# Patient Record
Sex: Female | Born: 1950 | Race: White | Hispanic: No | Smoking: Never smoker
Health system: Southern US, Community
[De-identification: ages and names within clinical notes are randomized; demographics above are authoritative.]

## PROBLEM LIST (undated history)

## (undated) DIAGNOSIS — I2699 Other pulmonary embolism without acute cor pulmonale: Secondary | ICD-10-CM

## (undated) DIAGNOSIS — M1990 Unspecified osteoarthritis, unspecified site: Secondary | ICD-10-CM

## (undated) DIAGNOSIS — I10 Essential (primary) hypertension: Secondary | ICD-10-CM

## (undated) DIAGNOSIS — G5600 Carpal tunnel syndrome, unspecified upper limb: Secondary | ICD-10-CM

## (undated) HISTORY — PX: CHOLECYSTECTOMY: SHX55

## (undated) HISTORY — PX: ORIF ANKLE FRACTURE: SHX5408

## (undated) HISTORY — DX: Essential (primary) hypertension: I10

## (undated) HISTORY — PX: ABDOMINAL HYSTERECTOMY: SHX81

## (undated) HISTORY — PX: KNEE REPAIR EXTENSOR MECHANISM: SHX6613

## (undated) HISTORY — DX: Unspecified osteoarthritis, unspecified site: M19.90

## (undated) HISTORY — PX: HAMMER TOE SURGERY: SHX385

## (undated) HISTORY — PX: MOLE REMOVAL: SHX2046

---

## 2019-05-16 HISTORY — PX: BIOPSY THYROID: PRO38

## 2019-06-18 ENCOUNTER — Other Ambulatory Visit: Payer: Self-pay

## 2019-06-19 ENCOUNTER — Encounter: Payer: Self-pay | Admitting: Family Medicine

## 2019-06-19 ENCOUNTER — Ambulatory Visit (INDEPENDENT_AMBULATORY_CARE_PROVIDER_SITE_OTHER): Payer: Medicare Other | Admitting: Family Medicine

## 2019-06-19 VITALS — BP 122/70 | HR 46 | Temp 98.2°F | Ht 62.0 in | Wt 262.8 lb

## 2019-06-19 DIAGNOSIS — M170 Bilateral primary osteoarthritis of knee: Secondary | ICD-10-CM | POA: Diagnosis not present

## 2019-06-19 DIAGNOSIS — I10 Essential (primary) hypertension: Secondary | ICD-10-CM | POA: Insufficient documentation

## 2019-06-19 NOTE — Progress Notes (Signed)
New Patient Office Visit  Subjective:  Patient ID: Kristin Lambert, female    DOB: August 26, 1950  Age: 69 y.o. MRN: [id number]  CC:  Chief Complaint  Patient presents with  . Establish Care    New patient, no concerns.     HPI Kristin Lambert presents for establishment of care.  Her husband has Alzheimer's disease and they had moved into this area from [place] to be closer to supportive family.  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  She has had a knee replacement on the right side.  Continues to have severe chronic knee pain.  She mostly takes Tylenol as needed.  Cannot take NSAIDs secondary to a rash.  She takes Norco on occasion.  Blood pressure is controlled.  Her medical list says that she is taking metoprolol succinate 100 mg daily.  Another list says that she is taking metoprolol metoprolol unspecified 100 mg twice daily.  She is not sure which one she is taking.  Past Medical History:  Diagnosis Date  . Arthritis   . Hypertension     Past Surgical History:  Procedure Laterality Date  . ABDOMINAL HYSTERECTOMY    . CESAREAN SECTION    . CHOLECYSTECTOMY    . KNEE REPAIR EXTENSOR MECHANISM Right     History reviewed. No pertinent family history.  Social History   Socioeconomic History  . Marital status: Married    Spouse name: Not on file  . Number of children: Not on file  . Years of education: Not on file  . Highest education level: Not on file  Occupational History  . Not on file  Tobacco Use  . Smoking status: Never Smoker  . Smokeless tobacco: Never Used  Substance and Sexual Activity  . Alcohol use: Never  . Drug use: Never  . Sexual activity: Not Currently  Other Topics Concern  . Not on file  Social History Narrative  . Not on file   Social Determinants of Health   Financial Resource Strain:   . Difficulty of Paying Living  Expenses:   Food Insecurity:   . Worried About Charity fundraiser in the Last Year:   . Arboriculturist in the Last Year:   Transportation Needs:   . Film/video editor (Medical):   [name] Lack of Transportation (Non-Medical):   Physical Activity:   . Days of Exercise per Week:   . Minutes of Exercise per Session:   Stress:   . Feeling of Stress :   Social Connections:   . Frequency of Communication with Friends and Family:   . Frequency of Social Gatherings with Friends and Family:   . Attends Religious Services:   . Active Member of Clubs or Organizations:   . Attends Archivist Meetings:   [name] Marital Status:   Intimate Partner Violence:   . Fear of Current or Ex-Partner:   . Emotionally Abused:   [name] Physically Abused:   . Sexually Abused:     ROS Review of Systems  Constitutional: Negative.   HENT: Negative.   Eyes: Negative for photophobia and visual disturbance.  Respiratory: Negative.   Cardiovascular: Negative.   Endocrine: Negative for polyphagia and polyuria.  Genitourinary: Negative.   Musculoskeletal: Positive for arthralgias and gait problem.  Allergic/Immunologic: Negative for immunocompromised state.  Neurological: Negative for light-headedness and numbness.  Hematological:  Does not bruise/bleed easily.  Psychiatric/Behavioral: Negative.     Objective:   Today's Vitals: BP 122/70   Pulse (!) 46   Temp 98.2 F (36.8 C) (Tympanic)   Ht 5\' 2"  (1.575 m)   Wt 262 lb 12.8 oz (119.2 kg)   SpO2 95%   BMI 48.07 kg/m   Physical Exam Vitals and nursing note reviewed.  Constitutional:      General: She is not in acute distress.    Appearance: Normal appearance. She is obese. She is not ill-appearing, toxic-appearing or diaphoretic.  HENT:     Head: Normocephalic and atraumatic.  Eyes:     General: No scleral icterus.       Right eye: No discharge.        Left eye: No discharge.     Conjunctiva/sclera: Conjunctivae normal.  Pulmonary:      Effort: Pulmonary effort is normal.  Neurological:     Mental Status: She is alert and oriented to person, place, and time.  Psychiatric:        Mood and Affect: Mood normal.        Behavior: Behavior normal.     Assessment & Plan:   Problem List Items Addressed This Visit      Cardiovascular and Mediastinum   Essential hypertension - Primary   Relevant Medications   metoprolol succinate (TOPROL-XL) 100 MG 24 hr tablet   hydrochlorothiazide (HYDRODIURIL) 25 MG tablet   losartan (COZAAR) 100 MG tablet   simvastatin (ZOCOR) 40 MG tablet   aspirin EC 81 MG tablet   amlodipine-benazepril (LOTREL) 2.5-10 MG capsule     Musculoskeletal and Integument   Arthritis of both knees   Relevant Medications   acetaminophen (TYLENOL) 500 MG tablet   aspirin EC 81 MG tablet   HYDROcodone-acetaminophen (NORCO) 7.5-325 MG tablet      Outpatient Encounter Medications as of 06/19/2019  Medication Sig  . acetaminophen (TYLENOL) 500 MG tablet Take 500 mg by mouth every 6 (six) hours as needed.  [name] amlodipine-benazepril (LOTREL) 2.5-10 MG capsule Take 1 capsule by mouth at bedtime.  [name] aspirin EC 81 MG tablet Take 81 mg by mouth daily.  . hydrochlorothiazide (HYDRODIURIL) 25 MG tablet Take 25 mg by mouth daily.  [name] HYDROcodone-acetaminophen (NORCO) 7.5-325 MG tablet Take 1 tablet by mouth every 6 (six) hours as needed for moderate pain.  [name] losartan (COZAAR) 100 MG tablet Take 100 mg by mouth daily.  . metoprolol succinate (TOPROL-XL) 100 MG 24 hr tablet Take 100 mg by mouth daily.  [name] oxybutynin (DITROPAN) 5 MG tablet Take 5 mg by mouth 2 (two) times daily.  . potassium chloride (KLOR-CON) 10 MEQ tablet Take 10 mEq by mouth daily.  . simvastatin (ZOCOR) 40 MG tablet Take 40 mg by mouth at bedtime.   No facility-administered encounter medications on file as of 06/19/2019.    Follow-up: Return in about 1 month (around 07/19/2019).   Patient's pulse rate is too low.  She will check her metoprolol type  and follow-up with me.  Otherwise I will see her in 1 month.  Libby Maw, MD

## 2019-07-21 ENCOUNTER — Ambulatory Visit: Payer: Medicare Other | Admitting: Family Medicine

## 2019-07-24 ENCOUNTER — Telehealth: Payer: Self-pay | Admitting: Family Medicine

## 2019-07-24 NOTE — Telephone Encounter (Signed)
Patient is calling and requested a call back, please advise. CB is [phone]

## 2019-07-29 ENCOUNTER — Other Ambulatory Visit: Payer: Self-pay

## 2019-07-30 ENCOUNTER — Encounter: Payer: Self-pay | Admitting: Family Medicine

## 2019-07-30 ENCOUNTER — Ambulatory Visit (INDEPENDENT_AMBULATORY_CARE_PROVIDER_SITE_OTHER): Payer: Medicare Other | Admitting: Family Medicine

## 2019-07-30 VITALS — BP 134/80 | HR 67 | Temp 98.2°F | Ht 62.0 in | Wt 261.4 lb

## 2019-07-30 DIAGNOSIS — E7800 Pure hypercholesterolemia, unspecified: Secondary | ICD-10-CM | POA: Diagnosis not present

## 2019-07-30 DIAGNOSIS — I10 Essential (primary) hypertension: Secondary | ICD-10-CM

## 2019-07-30 DIAGNOSIS — E559 Vitamin D deficiency, unspecified: Secondary | ICD-10-CM | POA: Diagnosis not present

## 2019-07-30 DIAGNOSIS — E785 Hyperlipidemia, unspecified: Secondary | ICD-10-CM | POA: Insufficient documentation

## 2019-07-30 MED ORDER — METOPROLOL SUCCINATE ER 100 MG PO TB24: 100.0000 mg | ORAL_TABLET | Freq: Every day | ORAL | 4 refills | Status: DC

## 2019-07-30 NOTE — Progress Notes (Signed)
Established Patient Office Visit  Subjective:  Patient ID: Kristin Lambert, female    DOB: 1950-12-21  Age: 70 y.o. MRN: [id number]  CC:  Chief Complaint  Patient presents with  . Follow-up    1 month follow up on BP, no concerns.     HPI Kristin Lambert presents for follow-up of her blood pressure.  Turns out she has been taking metoprolol tartrate twice daily.  Blood pressure has been controlled with amlodipine 2.5, metoprolol losartan and HCTZ.  Potassium was added over this past year for hypokalemia.  She has been taking simvastatin for her elevated cholesterol.  In Ditropan for irritable bladder.  She is fasting these past 8 hours.  Past Medical History:  Diagnosis Date  . Arthritis   . Hypertension     Past Surgical History:  Procedure Laterality Date  . ABDOMINAL HYSTERECTOMY    . CESAREAN SECTION    . CHOLECYSTECTOMY    . KNEE REPAIR EXTENSOR MECHANISM Right     History reviewed. No pertinent family history.  Social History   Socioeconomic History  . Marital status: Married    Spouse name: Not on file  . Number of children: Not on file  . Years of education: Not on file  . Highest education level: Not on file  Occupational History  . Not on file  Tobacco Use  . Smoking status: Never Smoker  . Smokeless tobacco: Never Used  Vaping Use  . Vaping Use: Former  Substance and Sexual Activity  . Alcohol use: Never  . Drug use: Never  . Sexual activity: Not Currently  Other Topics Concern  . Not on file  Social History Narrative  . Not on file   Social Determinants of Health   Financial Resource Strain:   . Difficulty of Paying Living Expenses:   Food Insecurity:   . Worried About Charity fundraiser in the Last Year:   . Arboriculturist in the Last Year:   Transportation Needs:   . Film/video editor (Medical):   [name] Lack of Transportation (Non-Medical):   Physical Activity:   . Days of Exercise per Week:   . Minutes of Exercise per Session:   Stress:    . Feeling of Stress :   Social Connections:   . Frequency of Communication with Friends and Family:   . Frequency of Social Gatherings with Friends and Family:   . Attends Religious Services:   . Active Member of Clubs or Organizations:   . Attends Archivist Meetings:   [name] Marital Status:   Intimate Partner Violence:   . Fear of Current or Ex-Partner:   . Emotionally Abused:   [name] Physically Abused:   . Sexually Abused:     Outpatient Medications Prior to Visit  Medication Sig Dispense Refill  . acetaminophen (TYLENOL) 500 MG tablet Take 500 mg by mouth every 6 (six) hours as needed.    [name] amlodipine-benazepril (LOTREL) 2.5-10 MG capsule Take 1 capsule by mouth at bedtime.    . Ascorbic Acid (VITAMIN C) 100 MG tablet Take 100 mg by mouth daily.    [name] aspirin EC 81 MG tablet Take 81 mg by mouth daily.    . cholecalciferol (VITAMIN D3) 25 MCG (1000 UNIT) tablet Take 1,000 Units by mouth daily.    . hydrochlorothiazide (HYDRODIURIL) 25 MG tablet Take 25 mg by mouth daily.    [name] losartan (COZAAR) 100 MG tablet Take 100 mg by mouth daily.    [name]  oxybutynin (DITROPAN) 5 MG tablet Take 5 mg by mouth 2 (two) times daily.    . potassium chloride (KLOR-CON) 10 MEQ tablet Take 10 mEq by mouth daily.    . simvastatin (ZOCOR) 40 MG tablet Take 40 mg by mouth at bedtime.    . metoprolol succinate (TOPROL-XL) 100 MG 24 hr tablet Take 100 mg by mouth daily.    [name] HYDROcodone-acetaminophen (NORCO) 7.5-325 MG tablet Take 1 tablet by mouth every 6 (six) hours as needed for moderate pain. (Patient not taking: Reported on 07/30/2019)     No facility-administered medications prior to visit.    Allergies  Allergen Reactions  . Nsaids     ROS Review of Systems  Constitutional: Negative.   HENT: Negative.   Eyes: Negative for photophobia and visual disturbance.  Respiratory: Negative.   Cardiovascular: Negative.   Gastrointestinal: Negative.   Endocrine: Negative for polyphagia and polyuria.    Genitourinary: Negative.   Musculoskeletal: Negative for gait problem and joint swelling.  Allergic/Immunologic: Negative for immunocompromised state.  Neurological: Negative for light-headedness and numbness.  Hematological: Does not bruise/bleed easily.  Psychiatric/Behavioral: Negative.       Objective:    Physical Exam Vitals and nursing note reviewed.  Constitutional:      General: She is not in acute distress.    Appearance: Normal appearance. She is not ill-appearing, toxic-appearing or diaphoretic.  HENT:     Head: Normocephalic and atraumatic.     Right Ear: External ear normal.     Left Ear: External ear normal.  Eyes:     General: No scleral icterus.       Right eye: No discharge.        Left eye: No discharge.     Conjunctiva/sclera: Conjunctivae normal.  Cardiovascular:     Rate and Rhythm: Normal rate and regular rhythm. Occasional extrasystoles are present. Pulmonary:     Effort: Pulmonary effort is normal.     Breath sounds: Normal breath sounds.  Skin:    General: Skin is warm and dry.  Neurological:     Mental Status: She is alert and oriented to person, place, and time.  Psychiatric:        Mood and Affect: Mood normal.        Behavior: Behavior normal.     BP 134/80   Pulse 67   Temp 98.2 F (36.8 C) (Tympanic)   Ht 5' 2" (1.575 m)   Wt 261 lb 6.4 oz (118.6 kg)   SpO2 97%   BMI 47.81 kg/m  Wt Readings from Last 3 Encounters:  07/30/19 261 lb 6.4 oz (118.6 kg)  06/19/19 262 lb 12.8 oz (119.2 kg)     Health Maintenance Due  Topic Date Due  . Hepatitis C Screening  Never done  . TETANUS/TDAP  Never done  . MAMMOGRAM  Never done  . COLONOSCOPY  Never done  . DEXA SCAN  Never done  . PNA vac Low Risk Adult (1 of 2 - PCV13) Never done    There are no preventive care reminders to display for this patient.  No results found for: TSH No results found for: WBC, HGB, HCT, MCV, PLT No results found for: NA, K, CHLORIDE, CO2, GLUCOSE, BUN,  CREATININE, BILITOT, ALKPHOS, AST, ALT, PROT, ALBUMIN, CALCIUM, ANIONGAP, EGFR, GFR No results found for: CHOL No results found for: HDL No results found for: LDLCALC No results found for: TRIG No results found for: CHOLHDL No results found for: HGBA1C  Assessment & Plan:   Problem List Items Addressed This Visit      Cardiovascular and Mediastinum   Essential hypertension - Primary   Relevant Medications   metoprolol succinate (TOPROL-XL) 100 MG 24 hr tablet   Other Relevant Orders   CBC   Comprehensive metabolic panel   TSH   Urinalysis, Routine w reflex microscopic     Other   Vitamin D deficiency   Relevant Orders   VITAMIN D 25 Hydroxy (Vit-D Deficiency, Fractures)   Elevated cholesterol   Relevant Medications   metoprolol succinate (TOPROL-XL) 100 MG 24 hr tablet   Other Relevant Orders   LDL cholesterol, direct   Lipid panel      Meds ordered this encounter  Medications  . metoprolol succinate (TOPROL-XL) 100 MG 24 hr tablet    Sig: Take 1 tablet (100 mg total) by mouth daily.    Dispense:  90 tablet    Refill:  4    Follow-up: Return in about 3 months (around 10/30/2019), or go ahead and stop the metoprolol tartrate. Start the metoprolol succinate once daily.. She had indicated earlier that she was taking Lotrel with amlodipine and Benzapril.  Expressed my concern about taking the ACE inhibitor with the ARB.  She did not believe she is only taking amlodipine alone.  Went ahead and changed it in the plan section.  She will go home and check to make sure she has not been taking the Lotrel.   Libby Maw, MD

## 2019-07-31 ENCOUNTER — Telehealth: Payer: Self-pay | Admitting: Family Medicine

## 2019-07-31 LAB — URINALYSIS, ROUTINE W REFLEX MICROSCOPIC
Bilirubin Urine: NEGATIVE
Hgb urine dipstick: NEGATIVE
Ketones, ur: NEGATIVE
Leukocytes,Ua: NEGATIVE
Nitrite: NEGATIVE
RBC / HPF: NONE SEEN (ref 0–?)
Specific Gravity, Urine: 1.02 (ref 1.000–1.030)
Total Protein, Urine: NEGATIVE
Urine Glucose: NEGATIVE
Urobilinogen, UA: 0.2 (ref 0.0–1.0)
WBC, UA: NONE SEEN (ref 0–?)
pH: 6 (ref 5.0–8.0)

## 2019-07-31 LAB — CBC
HCT: 38 % (ref 36.0–46.0)
Hemoglobin: 12.3 g/dL (ref 12.0–15.0)
MCHC: 32.3 g/dL (ref 30.0–36.0)
MCV: 89 fl (ref 78.0–100.0)
Platelets: 257 10*3/uL (ref 150.0–400.0)
RBC: 4.27 Mil/uL (ref 3.87–5.11)
RDW: 14.7 % (ref 11.5–15.5)
WBC: 11.7 10*3/uL — ABNORMAL HIGH (ref 4.0–10.5)

## 2019-07-31 LAB — COMPREHENSIVE METABOLIC PANEL
ALT: 9 U/L (ref 0–35)
AST: 14 U/L (ref 0–37)
Albumin: 4.2 g/dL (ref 3.5–5.2)
Alkaline Phosphatase: 61 U/L (ref 39–117)
BUN: 28 mg/dL — ABNORMAL HIGH (ref 6–23)
CO2: 25 mEq/L (ref 19–32)
Calcium: 9.5 mg/dL (ref 8.4–10.5)
Chloride: 105 mEq/L (ref 96–112)
Creatinine, Ser: 0.98 mg/dL (ref 0.40–1.20)
GFR: 56.13 mL/min — ABNORMAL LOW (ref >60.00)
Glucose, Bld: 95 mg/dL (ref 70–99)
Potassium: 4 mEq/L (ref 3.5–5.1)
Sodium: 139 mEq/L (ref 135–145)
Total Bilirubin: 0.5 mg/dL (ref 0.2–1.2)
Total Protein: 7 g/dL (ref 6.0–8.3)

## 2019-07-31 LAB — LIPID PANEL
Cholesterol: 99 mg/dL (ref 0–200)
HDL: 36 mg/dL — ABNORMAL LOW (ref >39.00)
LDL Cholesterol: 38 mg/dL (ref 0–99)
NonHDL: 62.7
Total CHOL/HDL Ratio: 3
Triglycerides: 124 mg/dL (ref 0.0–149.0)
VLDL: 24.8 mg/dL (ref 0.0–40.0)

## 2019-07-31 LAB — LDL CHOLESTEROL, DIRECT: Direct LDL: 47 mg/dL

## 2019-07-31 LAB — TSH: TSH: 1.53 u[IU]/mL (ref 0.35–4.50)

## 2019-07-31 LAB — VITAMIN D 25 HYDROXY (VIT D DEFICIENCY, FRACTURES): VITD: 31.23 ng/mL (ref 30.00–100.00)

## 2019-07-31 NOTE — Telephone Encounter (Signed)
Patient wanted Dr. Ethelene Hal to know that per the Pharmacist at [facility], she is not taking Lotrel, she is taking amlodipine that has salt additives.

## 2019-07-31 NOTE — Telephone Encounter (Signed)
Message below FYI.

## 2019-08-06 ENCOUNTER — Telehealth: Payer: Self-pay

## 2019-08-06 NOTE — Telephone Encounter (Signed)
Change is correct!

## 2019-08-06 NOTE — Telephone Encounter (Addendum)
Fax received from [facility] requesting verification of new rx for METOPROLOL [facility] 100 mg qd.   Last rx [facility] received was for metoprolol tartrate 100 mg bid.   Pt contacted and she confirmed that she is still currently taking the metoprolol tartrate 100 mg tablet bid.   Please advise if the change is correct

## 2019-09-01 ENCOUNTER — Telehealth: Payer: Self-pay | Admitting: Family Medicine

## 2019-09-01 NOTE — Telephone Encounter (Signed)
Called patient to inform that Rx was sent in back in July with 90 day supply and refills. No answer LMTCB

## 2019-09-01 NOTE — Telephone Encounter (Signed)
Patient is calling and requesting a refill for metoprolol sent to [facility], please advise. CB is [phone]

## 2019-09-02 ENCOUNTER — Telehealth: Payer: Self-pay | Admitting: Family Medicine

## 2019-09-02 NOTE — Telephone Encounter (Signed)
Ok with me 

## 2019-09-02 NOTE — Telephone Encounter (Signed)
Okay 

## 2019-09-02 NOTE — Telephone Encounter (Signed)
Patient is requesting to do a TOC to Dr. Bryan Lemma. She states that Dr. Ethelene Hal is not a "good fit" for her. Please advise if the transfer is okay.

## 2019-09-03 NOTE — Telephone Encounter (Signed)
Spoke with pharmacist refill will be delivered

## 2019-09-03 NOTE — Telephone Encounter (Signed)
Patient states [facility] told her they do not have that order for Metoprolol with refills. Please resend.

## 2019-09-09 ENCOUNTER — Telehealth: Payer: Self-pay | Admitting: Family Medicine

## 2019-09-09 NOTE — Telephone Encounter (Signed)
Patient is returning the call for lab results, please advise. CB is [phone]

## 2019-09-25 ENCOUNTER — Telehealth: Payer: Self-pay | Admitting: Family Medicine

## 2019-09-25 NOTE — Telephone Encounter (Signed)
Nurse triage called back with patient and they recommended patient to go to the ER and patient refused. Per patient she doesn't wish to go to the ER and just wants her medication changed, please advise. CB is [phone]

## 2019-09-25 NOTE — Telephone Encounter (Signed)
Patient called and stated that she has been having some elevated BP readings. Pt stated that when she had taken her BP this morning it was 192/90. Transferred caller to nurse triage for further evaluation.

## 2019-09-25 NOTE — Telephone Encounter (Signed)
Patient called back and wanted to let Dr. Ethelene Hal know that she has taken her BP medication and now her BP has came down to 164/78, please advise. CB is [phone]

## 2019-09-28 NOTE — Telephone Encounter (Signed)
Patient called the after hours nurse line with concerns about her blood pressure and her blood pressure medication. Please give her a call back at [phone].

## 2019-09-28 NOTE — Telephone Encounter (Signed)
Patient needs to be seen and bring all of the medicines that she is taking with her.

## 2019-09-28 NOTE — Telephone Encounter (Signed)
Spoke with patient who states that she feel as if her BP medication should be adjusted/increaded per patient her blood pressure seems to be elevated mid afternoon her diastolic usually in the 48'N or greater. She's not sure what she should and would like to know your recommendations. Please advise.

## 2019-09-28 NOTE — Telephone Encounter (Signed)
Appointment scheduled for pt to come in

## 2019-09-29 ENCOUNTER — Encounter: Payer: Self-pay | Admitting: Family Medicine

## 2019-09-29 ENCOUNTER — Ambulatory Visit (INDEPENDENT_AMBULATORY_CARE_PROVIDER_SITE_OTHER): Payer: Medicare Other | Admitting: Family Medicine

## 2019-09-29 ENCOUNTER — Other Ambulatory Visit: Payer: Self-pay

## 2019-09-29 VITALS — BP 122/80 | HR 60 | Temp 96.8°F | Wt 256.8 lb

## 2019-09-29 DIAGNOSIS — I10 Essential (primary) hypertension: Secondary | ICD-10-CM

## 2019-09-29 MED ORDER — METOPROLOL TARTRATE 100 MG PO TABS: 100.0000 mg | ORAL_TABLET | Freq: Two times a day (BID) | ORAL | 1 refills | Status: DC

## 2019-09-29 NOTE — Progress Notes (Signed)
Established Patient Office Visit  Subjective:  Patient ID: Kristin Lambert, female    DOB: Aug 18, 1950  Age: 69 y.o. MRN: [id number]  CC:  Chief Complaint  Patient presents with  . Hypertension    concerns about elevated BP readings patient states that readings seem to rise during the evening.     HPI Kristin Lambert presents for her blood pressure.  Today she brings in all of the medicines that she has been taking.  Medication for blood pressure include amlodipine 2.5 mg daily, furosemide 20 mg daily, HCTZ 25 mg daily, losartan 100 mg daily and metoprolol succinate 100 mg daily.  She is also taking 10 mEq potassium chloride.  Blood pressure in the p.m. is been running in the 140-179/70-90's on her current listed regimen.  She did not bring her cuff with her today.  She tells me that this has not been a problem in the past when she was taking the metoprolol tartrate. Bloop pressure in our clinic this afternoon is normal.  She brings in a pill bottle of apparent generic alendronate at 100 mg daily.  This was not our list.  Past Medical History:  Diagnosis Date  . Arthritis   . Hypertension     Past Surgical History:  Procedure Laterality Date  . ABDOMINAL HYSTERECTOMY    . CESAREAN SECTION    . CHOLECYSTECTOMY    . KNEE REPAIR EXTENSOR MECHANISM Right     No family history on file.  Social History   Socioeconomic History  . Marital status: Married    Spouse name: Not on file  . Number of children: Not on file  . Years of education: Not on file  . Highest education level: Not on file  Occupational History  . Not on file  Tobacco Use  . Smoking status: Never Smoker  . Smokeless tobacco: Never Used  Vaping Use  . Vaping Use: Former  Substance and Sexual Activity  . Alcohol use: Never  . Drug use: Never  . Sexual activity: Not Currently  Other Topics Concern  . Not on file  Social History Narrative  . Not on file   Social Determinants of Health   Financial Resource  Strain:   . Difficulty of Paying Living Expenses: Not on file  Food Insecurity:   . Worried About Charity fundraiser in the Last Year: Not on file  . Ran Out of Food in the Last Year: Not on file  Transportation Needs:   . Lack of Transportation (Medical): Not on file  . Lack of Transportation (Non-Medical): Not on file  Physical Activity:   . Days of Exercise per Week: Not on file  . Minutes of Exercise per Session: Not on file  Stress:   . Feeling of Stress : Not on file  Social Connections:   . Frequency of Communication with Friends and Family: Not on file  . Frequency of Social Gatherings with Friends and Family: Not on file  . Attends Religious Services: Not on file  . Active Member of Clubs or Organizations: Not on file  . Attends Archivist Meetings: Not on file  . Marital Status: Not on file  Intimate Partner Violence:   . Fear of Current or Ex-Partner: Not on file  . Emotionally Abused: Not on file  . Physically Abused: Not on file  . Sexually Abused: Not on file    Outpatient Medications Prior to Visit  Medication Sig Dispense Refill  . acetaminophen (TYLENOL) 500 MG  tablet Take 500 mg by mouth every 6 (six) hours as needed.    [name] amLODipine (NORVASC) 2.5 MG tablet Take 2.5 mg by mouth daily.    . Ascorbic Acid (VITAMIN C) 100 MG tablet Take 100 mg by mouth daily.    [name] aspirin EC 81 MG tablet Take 81 mg by mouth daily.    . cholecalciferol (VITAMIN D3) 25 MCG (1000 UNIT) tablet Take 1,000 Units by mouth daily.    . furosemide (LASIX) 20 MG tablet Take 20 mg by mouth daily.    . hydrochlorothiazide (HYDRODIURIL) 25 MG tablet Take 25 mg by mouth daily.    [name] losartan (COZAAR) 100 MG tablet Take 100 mg by mouth daily.    [name] oxybutynin (DITROPAN) 5 MG tablet Take 5 mg by mouth 2 (two) times daily.    . potassium chloride (KLOR-CON) 10 MEQ tablet Take 10 mEq by mouth daily.    . simvastatin (ZOCOR) 40 MG tablet Take 40 mg by mouth at bedtime.    . metoprolol  succinate (TOPROL-XL) 100 MG 24 hr tablet Take 1 tablet (100 mg total) by mouth daily. 90 tablet 4   No facility-administered medications prior to visit.    Allergies  Allergen Reactions  . Nsaids     ROS Review of Systems  Constitutional: Negative.   HENT: Negative.   Eyes: Negative for photophobia and visual disturbance.  Respiratory: Negative.   Cardiovascular: Negative.   Gastrointestinal: Negative.   Hematological: Does not bruise/bleed easily.  Psychiatric/Behavioral: Negative.       Objective:    Physical Exam Vitals and nursing note reviewed.  Constitutional:      General: She is not in acute distress.    Appearance: Normal appearance. She is not ill-appearing or toxic-appearing.  HENT:     Right Ear: External ear normal.     Left Ear: External ear normal.  Eyes:     General: No scleral icterus.       Right eye: No discharge.        Left eye: No discharge.     Conjunctiva/sclera: Conjunctivae normal.  Pulmonary:     Effort: Pulmonary effort is normal.  Neurological:     Mental Status: She is alert and oriented to person, place, and time.  Psychiatric:        Mood and Affect: Mood normal.        Behavior: Behavior normal.     BP 122/80   Pulse 60   Temp (!) 96.8 F (36 C) (Tympanic)   Wt 256 lb 12.8 oz (116.5 kg)   SpO2 96%   BMI 46.97 kg/m  Wt Readings from Last 3 Encounters:  09/29/19 256 lb 12.8 oz (116.5 kg)  07/30/19 261 lb 6.4 oz (118.6 kg)  06/19/19 262 lb 12.8 oz (119.2 kg)     Health Maintenance Due  Topic Date Due  . Hepatitis C Screening  Never done  . TETANUS/TDAP  Never done  . MAMMOGRAM  Never done  . COLONOSCOPY  Never done  . DEXA SCAN  Never done  . PNA vac Low Risk Adult (1 of 2 - PCV13) Never done  . INFLUENZA VACCINE  Never done    There are no preventive care reminders to display for this patient.  Lab Results  Component Value Date   TSH 1.53 07/30/2019   Lab Results  Component Value Date   WBC 11.7 (H)  07/30/2019   HGB 12.3 07/30/2019   HCT 38.0 07/30/2019  MCV 89.0 07/30/2019   PLT 257.0 07/30/2019   Lab Results  Component Value Date   NA 139 07/30/2019   K 4.0 07/30/2019   CO2 25 07/30/2019   GLUCOSE 95 07/30/2019   BUN 28 (H) 07/30/2019   CREATININE 0.98 07/30/2019   BILITOT 0.5 07/30/2019   ALKPHOS 61 07/30/2019   AST 14 07/30/2019   ALT 9 07/30/2019   PROT 7.0 07/30/2019   ALBUMIN 4.2 07/30/2019   CALCIUM 9.5 07/30/2019   GFR 56.13 (L) 07/30/2019   Lab Results  Component Value Date   CHOL 99 07/30/2019   Lab Results  Component Value Date   HDL 36.00 (L) 07/30/2019   Lab Results  Component Value Date   LDLCALC 38 07/30/2019   Lab Results  Component Value Date   TRIG 124.0 07/30/2019   Lab Results  Component Value Date   CHOLHDL 3 07/30/2019   No results found for: HGBA1C    Assessment & Plan:   Problem List Items Addressed This Visit      Cardiovascular and Mediastinum   Essential hypertension - Primary   Relevant Medications   furosemide (LASIX) 20 MG tablet   metoprolol tartrate (LOPRESSOR) 100 MG tablet      Meds ordered this encounter  Medications  . metoprolol tartrate (LOPRESSOR) 100 MG tablet    Sig: Take 1 tablet (100 mg total) by mouth 2 (two) times daily.    Dispense:  180 tablet    Refill:  1    Follow-up: Return return within month if BPs do not normalize with todays change. Bring your cuff..  Have switched her back to the metoprolol tartrate twice daily.  She will continue to check and record her blood pressures daily.  If her blood pressure remains elevated with her home cuff she will follow-up within the next month, otherwise with Dr. Bryan Lemma in November.  Given information on managing hypertension.  Libby Maw, MD

## 2019-09-29 NOTE — Patient Instructions (Signed)
   Managing Your Hypertension Hypertension is commonly called high blood pressure. This is when the force of your blood pressing against the walls of your arteries is too strong. Arteries are blood vessels that carry blood from your heart throughout your body. Hypertension forces the heart to work harder to pump blood, and may cause the arteries to become narrow or stiff. Having untreated or uncontrolled hypertension can cause heart attack, stroke, kidney disease, and other problems. What are blood pressure readings? A blood pressure reading consists of a higher number over a lower number. Ideally, your blood pressure should be below 120/80. The first ("top") number is called the systolic pressure. It is a measure of the pressure in your arteries as your heart beats. The second ("bottom") number is called the diastolic pressure. It is a measure of the pressure in your arteries as the heart relaxes. What does my blood pressure reading mean? Blood pressure is classified into four stages. Based on your blood pressure reading, your health care provider may use the following stages to determine what type of treatment you need, if any. Systolic pressure and diastolic pressure are measured in a unit called mm Hg. Normal  Systolic pressure: below 120.  Diastolic pressure: below 80. Elevated  Systolic pressure: 120-129.  Diastolic pressure: below 80. Hypertension stage 1  Systolic pressure: 130-139.  Diastolic pressure: 80-89. Hypertension stage 2  Systolic pressure: 140 or above.  Diastolic pressure: 90 or above. What health risks are associated with hypertension? Managing your hypertension is an important responsibility. Uncontrolled hypertension can lead to:  A heart attack.  A stroke.  A weakened blood vessel (aneurysm).  Heart failure.  Kidney damage.  Eye damage.  Metabolic syndrome.  Memory and concentration problems. What changes can I make to manage my  hypertension? Hypertension can be managed by making lifestyle changes and possibly by taking medicines. Your health care provider will help you make a plan to bring your blood pressure within a normal range. Eating and drinking   Eat a diet that is high in fiber and potassium, and low in salt (sodium), added sugar, and fat. An example eating plan is called the DASH (Dietary Approaches to Stop Hypertension) diet. To eat this way: ? Eat plenty of fresh fruits and vegetables. Try to fill half of your plate at each meal with fruits and vegetables. ? Eat whole grains, such as whole wheat pasta, brown rice, or whole grain bread. Fill about one quarter of your plate with whole grains. ? Eat low-fat diary products. ? Avoid fatty cuts of meat, processed or cured meats, and poultry with skin. Fill about one quarter of your plate with lean proteins such as fish, chicken without skin, beans, eggs, and tofu. ? Avoid premade and processed foods. These tend to be higher in sodium, added sugar, and fat.  Reduce your daily sodium intake. Most people with hypertension should eat less than 1,500 mg of sodium a day.  Limit alcohol intake to no more than 1 drink a day for nonpregnant women and 2 drinks a day for men. One drink equals 12 oz of beer, 5 oz of wine, or 1 oz of hard liquor. Lifestyle  Work with your health care provider to maintain a healthy body weight, or to lose weight. Ask what an ideal weight is for you.  Get at least 30 minutes of exercise that causes your heart to beat faster (aerobic exercise) most days of the week. Activities may include walking, swimming, or biking.    Include exercise to strengthen your muscles (resistance exercise), such as weight lifting, as part of your weekly exercise routine. Try to do these types of exercises for 30 minutes at least 3 days a week.  Do not use any products that contain nicotine or tobacco, such as cigarettes and e-cigarettes. If you need help quitting,  ask your health care provider.  Control any long-term (chronic) conditions you have, such as high cholesterol or diabetes. Monitoring  Monitor your blood pressure at home as told by your health care provider. Your personal target blood pressure may vary depending on your medical conditions, your age, and other factors.  Have your blood pressure checked regularly, as often as told by your health care provider. Working with your health care provider  Review all the medicines you take with your health care provider because there may be side effects or interactions.  Talk with your health care provider about your diet, exercise habits, and other lifestyle factors that may be contributing to hypertension.  Visit your health care provider regularly. Your health care provider can help you create and adjust your plan for managing hypertension. Will I need medicine to control my blood pressure? Your health care provider may prescribe medicine if lifestyle changes are not enough to get your blood pressure under control, and if:  Your systolic blood pressure is 130 or higher.  Your diastolic blood pressure is 80 or higher. Take medicines only as told by your health care provider. Follow the directions carefully. Blood pressure medicines must be taken as prescribed. The medicine does not work as well when you skip doses. Skipping doses also puts you at risk for problems. Contact a health care provider if:  You think you are having a reaction to medicines you have taken.  You have repeated (recurrent) headaches.  You feel dizzy.  You have swelling in your ankles.  You have trouble with your vision. Get help right away if:  You develop a severe headache or confusion.  You have unusual weakness or numbness, or you feel faint.  You have severe pain in your chest or abdomen.  You vomit repeatedly.  You have trouble breathing. Summary  Hypertension is when the force of blood pumping  through your arteries is too strong. If this condition is not controlled, it may put you at risk for serious complications.  Your personal target blood pressure may vary depending on your medical conditions, your age, and other factors. For most people, a normal blood pressure is less than 120/80.  Hypertension is managed by lifestyle changes, medicines, or both. Lifestyle changes include weight loss, eating a healthy, low-sodium diet, exercising more, and limiting alcohol. This information is not intended to replace advice given to you by your health care provider. Make sure you discuss any questions you have with your health care provider. Document Revised: 04/25/2018 Document Reviewed: 11/30/2015 Elsevier Patient Education  2020 [employer]  

## 2019-10-15 ENCOUNTER — Other Ambulatory Visit: Payer: Self-pay

## 2019-10-15 DIAGNOSIS — I10 Essential (primary) hypertension: Secondary | ICD-10-CM

## 2019-10-15 MED ORDER — POTASSIUM CHLORIDE ER 10 MEQ PO TBCR: 10.0000 meq | EXTENDED_RELEASE_TABLET | Freq: Every day | ORAL | 0 refills | Status: DC

## 2019-10-15 MED ORDER — METOPROLOL TARTRATE 100 MG PO TABS: 100.0000 mg | ORAL_TABLET | Freq: Two times a day (BID) | ORAL | 1 refills | Status: DC

## 2019-10-15 MED ORDER — AMLODIPINE BESYLATE 2.5 MG PO TABS: 2.5000 mg | ORAL_TABLET | Freq: Every day | ORAL | 0 refills | Status: DC

## 2019-10-24 LAB — EXTERNAL GENERIC LAB PROCEDURE

## 2019-10-24 LAB — COLOGUARD

## 2019-11-13 ENCOUNTER — Ambulatory Visit (INDEPENDENT_AMBULATORY_CARE_PROVIDER_SITE_OTHER): Payer: Medicare Other | Admitting: Family Medicine

## 2019-11-13 ENCOUNTER — Other Ambulatory Visit: Payer: Self-pay

## 2019-11-13 ENCOUNTER — Encounter: Payer: Self-pay | Admitting: Family Medicine

## 2019-11-13 VITALS — BP 130/74 | HR 82 | Temp 99.0°F | Ht 62.0 in | Wt 256.6 lb

## 2019-11-13 DIAGNOSIS — J302 Other seasonal allergic rhinitis: Secondary | ICD-10-CM | POA: Diagnosis not present

## 2019-11-13 DIAGNOSIS — Z8616 Personal history of COVID-19: Secondary | ICD-10-CM

## 2019-11-13 DIAGNOSIS — E559 Vitamin D deficiency, unspecified: Secondary | ICD-10-CM | POA: Diagnosis not present

## 2019-11-13 DIAGNOSIS — Z6841 Body Mass Index (BMI) 40.0 and over, adult: Secondary | ICD-10-CM

## 2019-11-13 DIAGNOSIS — E7800 Pure hypercholesterolemia, unspecified: Secondary | ICD-10-CM | POA: Diagnosis not present

## 2019-11-13 DIAGNOSIS — I10 Essential (primary) hypertension: Secondary | ICD-10-CM | POA: Diagnosis not present

## 2019-11-13 MED ORDER — FLUTICASONE PROPIONATE 50 MCG/ACT NA SUSP: 2.0000 | Freq: Every day | NASAL | 3 refills | Status: DC

## 2019-11-13 NOTE — Progress Notes (Signed)
Kristin Lambert is a 69 y.o. female  Chief Complaint  Patient presents with  . Establish Care    TOC- med refills.    HPI: Kristin Lambert is a 69 y.o. female seen today for TOC appt, previous PCP Dr. Ethelene Hal. She is the primary care giver for her husband, Dominica Severin, who has Alzheimer's. Moved in the past year from [place], [place] to be close to family (daughter Nira Conn and her husband Suezanne Jacquet). She is a retired Therapist, sports.  Pt has a PMHx significant for HTN, HLD, OA, Vit D deficiency.  Last labs 07/2019. She needs a refill of flonase. BP well-controlled. No issues. Does not need refills.  Past Medical History:  Diagnosis Date  . Arthritis   . Hypertension     Past Surgical History:  Procedure Laterality Date  . ABDOMINAL HYSTERECTOMY    . BIOPSY THYROID  05/2019  . CESAREAN SECTION    . CHOLECYSTECTOMY    . KNEE REPAIR EXTENSOR MECHANISM Right     Social History   Socioeconomic History  . Marital status: Married    Spouse name: Not on file  . Number of children: Not on file  . Years of education: Not on file  . Highest education level: Not on file  Occupational History  . Not on file  Tobacco Use  . Smoking status: Never Smoker  . Smokeless tobacco: Never Used  Vaping Use  . Vaping Use: Former  Substance and Sexual Activity  . Alcohol use: Never  . Drug use: Never  . Sexual activity: Not Currently  Other Topics Concern  . Not on file  Social History Narrative  . Not on file   Social Determinants of Health   Financial Resource Strain:   . Difficulty of Paying Living Expenses: Not on file  Food Insecurity:   . Worried About Charity fundraiser in the Last Year: Not on file  . Ran Out of Food in the Last Year: Not on file  Transportation Needs:   . Lack of Transportation (Medical): Not on file  . Lack of Transportation (Non-Medical): Not on file  Physical Activity:   . Days of Exercise per Week: Not on file  . Minutes of Exercise per Session: Not on file  Stress:   . Feeling of  Stress : Not on file  Social Connections:   . Frequency of Communication with Friends and Family: Not on file  . Frequency of Social Gatherings with Friends and Family: Not on file  . Attends Religious Services: Not on file  . Active Member of Clubs or Organizations: Not on file  . Attends Archivist Meetings: Not on file  . Marital Status: Not on file  Intimate Partner Violence:   . Fear of Current or Ex-Partner: Not on file  . Emotionally Abused: Not on file  . Physically Abused: Not on file  . Sexually Abused: Not on file    History reviewed. No pertinent family history.   Immunization History  Administered Date(s) Administered  . PFIZER SARS-COV-2 Vaccination 03/19/2019, 04/11/2019, 11/06/2019    Outpatient Encounter Medications as of 11/13/2019  Medication Sig  . acetaminophen (TYLENOL) 500 MG tablet Take 500 mg by mouth every 6 (six) hours as needed.  [name] amLODipine (NORVASC) 2.5 MG tablet Take 1 tablet (2.5 mg total) by mouth daily.  . Ascorbic Acid (VITAMIN C) 100 MG tablet Take 100 mg by mouth daily.  [name] aspirin EC 81 MG tablet Take 81 mg by mouth daily.  [name]  cholecalciferol (VITAMIN D3) 25 MCG (1000 UNIT) tablet Take 1,000 Units by mouth daily.  . fluticasone (FLONASE) 50 MCG/ACT nasal spray Place into both nostrils daily.  . furosemide (LASIX) 20 MG tablet Take 20 mg by mouth daily.  . hydrochlorothiazide (HYDRODIURIL) 25 MG tablet Take 25 mg by mouth daily.  [name] losartan (COZAAR) 100 MG tablet Take 100 mg by mouth daily.  . metoprolol tartrate (LOPRESSOR) 100 MG tablet Take 1 tablet (100 mg total) by mouth 2 (two) times daily.  [name] oxybutynin (DITROPAN) 5 MG tablet Take 5 mg by mouth 2 (two) times daily.  . potassium chloride (KLOR-CON) 10 MEQ tablet Take 1 tablet (10 mEq total) by mouth daily.  . simvastatin (ZOCOR) 40 MG tablet Take 40 mg by mouth at bedtime.   No facility-administered encounter medications on file as of 11/13/2019.     ROS: Pertinent  positives and negatives noted in HPI. Remainder of ROS non-contributory   Allergies  Allergen Reactions  . Nsaids     BP 130/74   Pulse 82   Temp 99 F (37.2 C) (Temporal)   Ht 5\' 2"  (1.575 m)   Wt 256 lb 9.6 oz (116.4 kg)   SpO2 95%   BMI 46.93 kg/m  BP Readings from Last 3 Encounters:  11/13/19 130/74  09/29/19 122/80  07/30/19 134/80    Physical Exam Constitutional:      General: She is not in acute distress.    Appearance: Normal appearance. She is not ill-appearing.  Cardiovascular:     Rate and Rhythm: Normal rate and regular rhythm.     Pulses: Normal pulses.  Pulmonary:     Effort: Pulmonary effort is normal. No respiratory distress.     Breath sounds: Normal breath sounds. No wheezing or rhonchi.  Musculoskeletal:     Right lower leg: No edema.     Left lower leg: No edema.  Neurological:     Mental Status: She is alert and oriented to person, place, and time.  Psychiatric:        Mood and Affect: Mood normal.        Behavior: Behavior normal.      A/P:  1. Essential hypertension - controlled, at goal - HCTZ 25mg  daily, losartan 1000mg  BID, metoprolol 100mg  BID, norvasc 2.5mg  daily - cont low sodium diet  2. Elevated cholesterol - last FLP in 07/2019 - cont zocor 40mg  daily  3. Vitamin D deficiency - on vit D 1000IU daily  4. Morbid obesity with BMI of 45.0-49.9, adult ([place]) - pt states she has been overweight/obese her entire life - limited d/t OA  5. Seasonal allergies - controlled Refill: - fluticasone (FLONASE) 50 MCG/ACT nasal spray; Place 2 sprays into both nostrils daily.  Dispense: 16 g; Refill: 3  6. Personal history of COVID-19 - still dealing with fatigue   This visit occurred during the SARS-CoV-2 public health emergency.  Safety protocols were in place, including screening questions prior to the visit, additional usage of staff PPE, and extensive cleaning of exam room while observing appropriate contact time as indicated for  disinfecting solutions.

## 2019-11-26 ENCOUNTER — Encounter: Payer: Medicare Other | Admitting: Family Medicine

## 2019-12-15 ENCOUNTER — Ambulatory Visit (INDEPENDENT_AMBULATORY_CARE_PROVIDER_SITE_OTHER): Payer: Medicare Other

## 2019-12-15 VITALS — Ht 62.0 in | Wt 256.0 lb

## 2019-12-15 DIAGNOSIS — Z1211 Encounter for screening for malignant neoplasm of colon: Secondary | ICD-10-CM

## 2019-12-15 DIAGNOSIS — Z0000 Encounter for general adult medical examination without abnormal findings: Secondary | ICD-10-CM

## 2019-12-15 NOTE — Patient Instructions (Signed)
Ms. Kristin Lambert , Thank you for taking time to complete your Medicare Wellness Visit. I appreciate your ongoing commitment to your health goals. Please review the following plan we discussed and let me know if I can assist you in the future.   Screening recommendations/referrals: Colonoscopy: Cologuard ordered today Mammogram: Per our conversation today,Completed 01/2019-Due 01/2020 Bone Density: Per our conversation, Completed in 2020-Due-2022 Recommended yearly ophthalmology/optometry visit for glaucoma screening and checkup Recommended yearly dental visit for hygiene and checkup  Vaccinations: Influenza vaccine: Up to date Pneumococcal vaccine: Completed vaccines Tdap vaccine: Discuss with pharmacy Shingles vaccine: Discuss with pharmacy   Covid-19:Completed vaccines  Advanced directives: Please bring a copy for your chart  Conditions/risks identified: See problem list  Next appointment: Follow up in one year for your annual wellness visit    Preventive Care 65 Years and Older, Female Preventive care refers to lifestyle choices and visits with your health care provider that can promote health and wellness. What does preventive care include?  A yearly physical exam. This is also called an annual well check.  Dental exams once or twice a year.  Routine eye exams. Ask your health care provider how often you should have your eyes checked.  Personal lifestyle choices, including:  Daily care of your teeth and gums.  Regular physical activity.  Eating a healthy diet.  Avoiding tobacco and drug use.  Limiting alcohol use.  Practicing safe sex.  Taking low-dose aspirin every day.  Taking vitamin and mineral supplements as recommended by your health care provider. What happens during an annual well check? The services and screenings done by your health care provider during your annual well check will depend on your age, overall health, lifestyle risk factors, and family history  of disease. Counseling  Your health care provider may ask you questions about your:  Alcohol use.  Tobacco use.  Drug use.  Emotional well-being.  Home and relationship well-being.  Sexual activity.  Eating habits.  History of falls.  Memory and ability to understand (cognition).  Work and work Statistician.  Reproductive health. Screening  You may have the following tests or measurements:  Height, weight, and BMI.  Blood pressure.  Lipid and cholesterol levels. These may be checked every 5 years, or more frequently if you are over 88 years old.  Skin check.  Lung cancer screening. You may have this screening every year starting at age 28 if you have a 30-pack-year history of smoking and currently smoke or have quit within the past 15 years.  Fecal occult blood test (FOBT) of the stool. You may have this test every year starting at age 66.  Flexible sigmoidoscopy or colonoscopy. You may have a sigmoidoscopy every 5 years or a colonoscopy every 10 years starting at age 30.  Hepatitis C blood test.  Hepatitis B blood test.  Sexually transmitted disease (STD) testing.  Diabetes screening. This is done by checking your blood sugar (glucose) after you have not eaten for a while (fasting). You may have this done every 1-3 years.  Bone density scan. This is done to screen for osteoporosis. You may have this done starting at age 70.  Mammogram. This may be done every 1-2 years. Talk to your health care provider about how often you should have regular mammograms. Talk with your health care provider about your test results, treatment options, and if necessary, the need for more tests. Vaccines  Your health care provider may recommend certain vaccines, such as:  Influenza vaccine. This is  recommended every year.  Tetanus, diphtheria, and acellular pertussis (Tdap, Td) vaccine. You may need a Td booster every 10 years.  Zoster vaccine. You may need this after age  76.  Pneumococcal 13-valent conjugate (PCV13) vaccine. One dose is recommended after age 2.  Pneumococcal polysaccharide (PPSV23) vaccine. One dose is recommended after age 69. Talk to your health care provider about which screenings and vaccines you need and how often you need them. This information is not intended to replace advice given to you by your health care provider. Make sure you discuss any questions you have with your health care provider. Document Released: 01/28/2015 Document Revised: 09/21/2015 Document Reviewed: 11/02/2014 Elsevier Interactive Patient Education  2017 [place] Prevention in the Home Falls can cause injuries. They can happen to people of all ages. There are many things you can do to make your home safe and to help prevent falls. What can I do on the outside of my home?  Regularly fix the edges of walkways and driveways and fix any cracks.  Remove anything that might make you trip as you walk through a door, such as a raised step or threshold.  Trim any bushes or trees on the path to your home.  Use bright outdoor lighting.  Clear any walking paths of anything that might make someone trip, such as rocks or tools.  Regularly check to see if handrails are loose or broken. Make sure that both sides of any steps have handrails.  Any raised decks and porches should have guardrails on the edges.  Have any leaves, snow, or ice cleared regularly.  Use sand or salt on walking paths during winter.  Clean up any spills in your garage right away. This includes oil or grease spills. What can I do in the bathroom?  Use night lights.  Install grab bars by the toilet and in the tub and shower. Do not use towel bars as grab bars.  Use non-skid mats or decals in the tub or shower.  If you need to sit down in the shower, use a plastic, non-slip stool.  Keep the floor dry. Clean up any water that spills on the floor as soon as it happens.  Remove  soap buildup in the tub or shower regularly.  Attach bath mats securely with double-sided non-slip rug tape.  Do not have throw rugs and other things on the floor that can make you trip. What can I do in the bedroom?  Use night lights.  Make sure that you have a light by your bed that is easy to reach.  Do not use any sheets or blankets that are too big for your bed. They should not hang down onto the floor.  Have a firm chair that has side arms. You can use this for support while you get dressed.  Do not have throw rugs and other things on the floor that can make you trip. What can I do in the kitchen?  Clean up any spills right away.  Avoid walking on wet floors.  Keep items that you use a lot in easy-to-reach places.  If you need to reach something above you, use a strong step stool that has a grab bar.  Keep electrical cords out of the way.  Do not use floor polish or wax that makes floors slippery. If you must use wax, use non-skid floor wax.  Do not have throw rugs and other things on the floor that can make you trip.  What can I do with my stairs?  Do not leave any items on the stairs.  Make sure that there are handrails on both sides of the stairs and use them. Fix handrails that are broken or loose. Make sure that handrails are as long as the stairways.  Check any carpeting to make sure that it is firmly attached to the stairs. Fix any carpet that is loose or worn.  Avoid having throw rugs at the top or bottom of the stairs. If you do have throw rugs, attach them to the floor with carpet tape.  Make sure that you have a light switch at the top of the stairs and the bottom of the stairs. If you do not have them, ask someone to add them for you. What else can I do to help prevent falls?  Wear shoes that:  Do not have high heels.  Have rubber bottoms.  Are comfortable and fit you well.  Are closed at the toe. Do not wear sandals.  If you use a  stepladder:  Make sure that it is fully opened. Do not climb a closed stepladder.  Make sure that both sides of the stepladder are locked into place.  Ask someone to hold it for you, if possible.  Clearly mark and make sure that you can see:  Any grab bars or handrails.  First and last steps.  Where the edge of each step is.  Use tools that help you move around (mobility aids) if they are needed. These include:  Canes.  Walkers.  Scooters.  Crutches.  Turn on the lights when you go into a dark area. Replace any light bulbs as soon as they burn out.  Set up your furniture so you have a clear path. Avoid moving your furniture around.  If any of your floors are uneven, fix them.  If there are any pets around you, be aware of where they are.  Review your medicines with your doctor. Some medicines can make you feel dizzy. This can increase your chance of falling. Ask your doctor what other things that you can do to help prevent falls. This information is not intended to replace advice given to you by your health care provider. Make sure you discuss any questions you have with your health care provider. Document Released: 10/28/2008 Document Revised: 06/09/2015 Document Reviewed: 02/05/2014 Elsevier Interactive Patient Education  2017 [employer].

## 2019-12-15 NOTE — Progress Notes (Signed)
Subjective:   Kristin Lambert is a 69 y.o. female who presents for an Initial Medicare Annual Wellness Visit.  I connected with Kristin Lambert today by telephone and verified that I am speaking with the correct person using two identifiers. Location patient: home Location provider: work Persons participating in the virtual visit: patient, Marine scientist.    I discussed the limitations, risks, security and privacy concerns of performing an evaluation and management service by telephone and the availability of in person appointments. I also discussed with the patient that there may be a patient responsible charge related to this service. The patient expressed understanding and verbally consented to this telephonic visit.    Interactive audio and video telecommunications were attempted between this provider and patient, however failed, due to patient having technical difficulties OR patient did not have access to video capability.  We continued and completed visit with audio only.  Some vital signs may be absent or patient reported.   Time Spent with patient on telephone encounter: 25 minutes  Review of Systems     Cardiac Risk Factors include: advanced age (>20men, >42 women);hypertension;dyslipidemia;obesity (BMI >30kg/m2)     Objective:    Today's Vitals   12/15/19 1419  Weight: 256 lb (116.1 kg)  Height: 5\' 2"  (1.575 m)   Body mass index is 46.82 kg/m.  Advanced Directives 12/15/2019  Does Patient Have a Medical Advance Directive? Yes  Type of Paramedic of [place];Living will  Copy of [place] in Chart? No - copy requested    Current Medications (verified) Outpatient Encounter Medications as of 12/15/2019  Medication Sig  . acetaminophen (TYLENOL) 500 MG tablet Take 500 mg by mouth every 6 (six) hours as needed.  [name] amLODipine (NORVASC) 2.5 MG tablet Take 1 tablet (2.5 mg total) by mouth daily.  . Ascorbic Acid (VITAMIN C) 100 MG tablet Take 100  mg by mouth daily.  [name] aspirin EC 81 MG tablet Take 81 mg by mouth daily.  . cholecalciferol (VITAMIN D3) 25 MCG (1000 UNIT) tablet Take 1,000 Units by mouth daily.  . fluticasone (FLONASE) 50 MCG/ACT nasal spray Place 2 sprays into both nostrils daily.  . furosemide (LASIX) 20 MG tablet Take 20 mg by mouth daily.  . hydrochlorothiazide (HYDRODIURIL) 25 MG tablet Take 25 mg by mouth daily.  [name] losartan (COZAAR) 100 MG tablet Take 100 mg by mouth daily.  . metoprolol tartrate (LOPRESSOR) 100 MG tablet Take 1 tablet (100 mg total) by mouth 2 (two) times daily.  [name] oxybutynin (DITROPAN) 5 MG tablet Take 5 mg by mouth 2 (two) times daily.  . potassium chloride (KLOR-CON) 10 MEQ tablet Take 1 tablet (10 mEq total) by mouth daily.  . simvastatin (ZOCOR) 40 MG tablet Take 40 mg by mouth at bedtime.   No facility-administered encounter medications on file as of 12/15/2019.    Allergies (verified) Nsaids   History: Past Medical History:  Diagnosis Date  . Arthritis   . Hypertension    Past Surgical History:  Procedure Laterality Date  . ABDOMINAL HYSTERECTOMY    . BIOPSY THYROID  05/2019  . CESAREAN SECTION    . CHOLECYSTECTOMY    . KNEE REPAIR EXTENSOR MECHANISM Right    History reviewed. No pertinent family history. Social History   Socioeconomic History  . Marital status: Married    Spouse name: Not on file  . Number of children: Not on file  . Years of education: Not on file  . Highest education level:  Not on file  Occupational History  . Not on file  Tobacco Use  . Smoking status: Never Smoker  . Smokeless tobacco: Never Used  Vaping Use  . Vaping Use: Former  Substance and Sexual Activity  . Alcohol use: Never  . Drug use: Never  . Sexual activity: Not Currently  Other Topics Concern  . Not on file  Social History Narrative  . Not on file   Social Determinants of Health   Financial Resource Strain: Low Risk   . Difficulty of Paying Living Expenses: Not hard at  all  Food Insecurity: No Food Insecurity  . Worried About Charity fundraiser in the Last Year: Never true  . Ran Out of Food in the Last Year: Never true  Transportation Needs: No Transportation Needs  . Lack of Transportation (Medical): No  . Lack of Transportation (Non-Medical): No  Physical Activity: Inactive  . Days of Exercise per Week: 0 days  . Minutes of Exercise per Session: 0 min  Stress: Stress Concern Present  . Feeling of Stress : To some extent  Social Connections: Moderately Integrated  . Frequency of Communication with Friends and Family: More than three times a week  . Frequency of Social Gatherings with Friends and Family: Twice a week  . Attends Religious Services: 1 to 4 times per year  . Active Member of Clubs or Organizations: No  . Attends Archivist Meetings: Never  . Marital Status: Married    Tobacco Counseling Counseling given: Not Answered   Clinical Intake:  Pre-visit preparation completed: Yes  Pain : No/denies pain     Nutritional Status: BMI > 30  Obese Nutritional Risks: None Diabetes: No  How often do you need to have someone help you when you read instructions, pamphlets, or other written materials from your doctor or pharmacy?: 1 - Never What is the last grade level you completed in school?: Associate's degree in Nursing  Diabetic?No  Interpreter Needed?: No  Information entered by :: Caroleen Hamman LPN   Activities of Daily Living In your present state of health, do you have any difficulty performing the following activities: 12/15/2019  Hearing? N  Vision? N  Difficulty concentrating or making decisions? N  Walking or climbing stairs? N  Dressing or bathing? N  Doing errands, shopping? N  Preparing Food and eating ? N  Using the Toilet? N  In the past six months, have you accidently leaked urine? Y  Comment wears a pad  Do you have problems with loss of bowel control? N  Managing your Medications? N    Managing your Finances? N  Housekeeping or managing your Housekeeping? N  Some recent data might be hidden    Patient Care Team: Libby Maw, MD as PCP - General (Family Medicine)  Indicate any recent Medical Services you may have received from other than [facility] providers in the past year (date may be approximate).     Assessment:   This is a routine wellness examination for Kristin Lambert.  Hearing/Vision screen  Hearing Screening   125Hz  250Hz  500Hz  1000Hz  2000Hz  3000Hz  4000Hz  6000Hz  8000Hz   Right ear:           Left ear:           Comments: No issues  Vision Screening Comments: Wears reading glasses Last eye exam-several years ago  Dietary issues and exercise activities discussed: Current Exercise Habits: The patient does not participate in regular exercise at present, Exercise limited by: orthopedic  condition(s)  Goals    . Patient Stated     Eat a healthy diet & maintain current health      Depression Screen PHQ 2/9 Scores 12/15/2019 06/19/2019  PHQ - 2 Score 1 1  PHQ- 9 Score - 4    Fall Risk Fall Risk  12/15/2019  Falls in the past year? 1  Number falls in past yr: 0  Injury with Fall? 0  Risk for fall due to : History of fall(s)  Follow up Falls prevention discussed    Any stairs in or around the home? No  Home free of loose throw rugs in walkways, pet beds, electrical cords, etc? Yes  Adequate lighting in your home to reduce risk of falls? Yes   ASSISTIVE DEVICES UTILIZED TO PREVENT FALLS:  Life alert? No  Use of a cane, walker or w/c? No  Grab bars in the bathroom? Yes  Shower chair or bench in shower? Yes  Elevated toilet seat or a handicapped toilet? No   TIMED UP AND GO:  Was the test performed? No . Phone visit   Cognitive Function:No cognitive impairment noted        Immunizations Immunization History  Administered Date(s) Administered  . PFIZER SARS-COV-2 Vaccination 03/19/2019, 04/11/2019, 11/06/2019    TDAP status: Due,  Education has been provided regarding the importance of this vaccine. Advised may receive this vaccine at local pharmacy or Health Dept. Aware to provide a copy of the vaccination record if obtained from local pharmacy or Health Dept. Verbalized acceptance and understanding.   Flu Vaccine status: Up to date   Pneumococcal vaccine status: Up to date Per patient-Awaiting documentation from previous provider  Covid-19 vaccine status: Completed vaccines  Qualifies for Shingles Vaccine? No   Zostavax completed No   Shingrix Completed?: No.    Education has been provided regarding the importance of this vaccine. Patient has been advised to call insurance company to determine out of pocket expense if they have not yet received this vaccine. Advised may also receive vaccine at local pharmacy or Health Dept. Verbalized acceptance and understanding.  Screening Tests Health Maintenance  Topic Date Due  . Hepatitis C Screening  Never done  . TETANUS/TDAP  Never done  . MAMMOGRAM  Never done  . COLONOSCOPY  Never done  . DEXA SCAN  Never done  . PNA vac Low Risk Adult (1 of 2 - PCV13) Never done  . INFLUENZA VACCINE  Never done  . COVID-19 Vaccine  Completed    Health Maintenance  Health Maintenance Due  Topic Date Due  . Hepatitis C Screening  Never done  . TETANUS/TDAP  Never done  . MAMMOGRAM  Never done  . COLONOSCOPY  Never done  . DEXA SCAN  Never done  . PNA vac Low Risk Adult (1 of 2 - PCV13) Never done  . INFLUENZA VACCINE  Never done    Colorectal cancer screening:Cologuard ordered today.  Mammogram status: Completed 01/2019. Repeat every year  Per patient-Awaiting documentation from previous provider  Bone Density status: Completed 2020. Results reflect: Bone density results: OSTEOPOROSIS. Repeat every 2 years. Per patient-Awaiting documentation from previous provider  Lung Cancer Screening: (Low Dose CT Chest recommended if Age 109-80 years, 30 pack-year currently smoking OR  have quit w/in 15years.) does not qualify.     Additional Screening:  Hepatitis C Screening: does qualify; Discuss with PCP  Vision Screening: Recommended annual ophthalmology exams for early detection of glaucoma and other disorders of the eye.  Is the patient up to date with their annual eye exam?  No  Who is the provider or what is the name of the office in which the patient attends annual eye exams? Unsure of name Patient plans to make an appt  Dental Screening: Recommended annual dental exams for proper oral hygiene  Community Resource Referral / Chronic Care Management: CRR required this visit?  No   CCM required this visit?  No      Plan:     I have personally reviewed and noted the following in the patient's chart:   . Medical and social history . Use of alcohol, tobacco or illicit drugs  . Current medications and supplements . Functional ability and status . Nutritional status . Physical activity . Advanced directives . List of other physicians . Hospitalizations, surgeries, and ER visits in previous 12 months . Vitals . Screenings to include cognitive, depression, and falls . Referrals and appointments  In addition, I have reviewed and discussed with patient certain preventive protocols, quality metrics, and best practice recommendations. A written personalized care plan for preventive services as well as general preventive health recommendations were provided to patient.   Due to this being a telephonic visit, the after visit summary with patients personalized plan was offered to patient via mail or my-chart. Patient declined at this time.   Marta Antu, LPN   58/25/1898  Nurse Health Advisor  Nurse Notes: None

## 2020-01-04 ENCOUNTER — Other Ambulatory Visit: Payer: Self-pay | Admitting: Family Medicine

## 2020-01-04 DIAGNOSIS — L84 Corns and callosities: Secondary | ICD-10-CM | POA: Diagnosis not present

## 2020-01-04 DIAGNOSIS — B351 Tinea unguium: Secondary | ICD-10-CM | POA: Diagnosis not present

## 2020-01-04 DIAGNOSIS — M65872 Other synovitis and tenosynovitis, left ankle and foot: Secondary | ICD-10-CM | POA: Diagnosis not present

## 2020-01-26 ENCOUNTER — Other Ambulatory Visit: Payer: Self-pay | Admitting: Family Medicine

## 2020-02-08 DIAGNOSIS — Z1211 Encounter for screening for malignant neoplasm of colon: Secondary | ICD-10-CM | POA: Diagnosis not present

## 2020-02-08 DIAGNOSIS — Z1212 Encounter for screening for malignant neoplasm of rectum: Secondary | ICD-10-CM | POA: Diagnosis not present

## 2020-02-08 LAB — COLOGUARD: Cologuard: NEGATIVE

## 2020-02-16 LAB — EXTERNAL GENERIC LAB PROCEDURE: COLOGUARD: NEGATIVE

## 2020-02-16 LAB — COLOGUARD: COLOGUARD: NEGATIVE

## 2020-02-25 ENCOUNTER — Telehealth: Payer: Self-pay

## 2020-02-25 NOTE — Telephone Encounter (Signed)
Pt calling to inquire about the results of her colorgard test that she took in Nov.  I did not see where it has been released.  Please advise. [phone]

## 2020-02-25 NOTE — Telephone Encounter (Signed)
I see where cologuard was ordered by Jana Half at pts [facility] but I do not see a result scanned into the chart under media or any other tab. Langley Gauss, can you please call [facility] [phone] to ask for result to be faxed to [place]? Thank you

## 2020-02-26 NOTE — Telephone Encounter (Signed)
Patient notified VIA phone. Dm/cma  

## 2020-02-26 NOTE — Telephone Encounter (Signed)
lft VM to rtn call. Dm/cma  

## 2020-02-26 NOTE — Telephone Encounter (Signed)
Result received and negative. Repeat in 3 years. Please call pt with this info

## 2020-02-26 NOTE — Telephone Encounter (Signed)
Spoke to [employer] and they will fax the results to [phone] ([facility] fax#).  Results received and placed on you desk for review.  Dm/cma

## 2020-03-02 ENCOUNTER — Encounter: Payer: Self-pay | Admitting: Family Medicine

## 2020-03-22 ENCOUNTER — Telehealth: Payer: Self-pay

## 2020-03-22 NOTE — Telephone Encounter (Signed)
Last OV 11/13/19 Last fill for both medications  06/19/19 Historical provider

## 2020-03-23 MED ORDER — OXYBUTYNIN CHLORIDE 5 MG PO TABS: 5.0000 mg | ORAL_TABLET | Freq: Two times a day (BID) | ORAL | 1 refills | Status: DC

## 2020-03-23 MED ORDER — SIMVASTATIN 40 MG PO TABS: 40.0000 mg | ORAL_TABLET | Freq: Every evening | ORAL | 1 refills | Status: DC

## 2020-03-23 NOTE — Telephone Encounter (Signed)
Both refills sent to [facility]

## 2020-04-20 DIAGNOSIS — B351 Tinea unguium: Secondary | ICD-10-CM | POA: Diagnosis not present

## 2020-04-20 DIAGNOSIS — L84 Corns and callosities: Secondary | ICD-10-CM | POA: Diagnosis not present

## 2020-04-26 ENCOUNTER — Telehealth: Payer: Self-pay

## 2020-04-26 NOTE — Telephone Encounter (Signed)
Last OV 11/13/2019 Last fill historical provider Please advise.

## 2020-04-28 ENCOUNTER — Telehealth: Payer: Self-pay

## 2020-04-28 ENCOUNTER — Telehealth: Payer: Self-pay | Admitting: Family Medicine

## 2020-04-28 DIAGNOSIS — I10 Essential (primary) hypertension: Secondary | ICD-10-CM

## 2020-04-28 DIAGNOSIS — J302 Other seasonal allergic rhinitis: Secondary | ICD-10-CM

## 2020-04-28 MED ORDER — HYDROCHLOROTHIAZIDE 25 MG PO TABS: 25.0000 mg | ORAL_TABLET | Freq: Every day | ORAL | 0 refills | Status: DC

## 2020-04-28 MED ORDER — METOPROLOL TARTRATE 100 MG PO TABS: 100.0000 mg | ORAL_TABLET | Freq: Two times a day (BID) | ORAL | 0 refills | Status: DC

## 2020-04-28 MED ORDER — FLUTICASONE PROPIONATE 50 MCG/ACT NA SUSP: 2.0000 | Freq: Every day | NASAL | 1 refills | Status: DC

## 2020-04-28 NOTE — Telephone Encounter (Signed)
Rx sent to requested pharmacy

## 2020-04-28 NOTE — Telephone Encounter (Signed)
What is the name of the medication? metoprolol tartrate (LOPRESSOR) 100 MG tablet [[id number]] and fluticasone (FLONASE) 50 MCG/ACT nasal spray [[id number]]    Have you contacted your pharmacy to request a refill? She is needing a refill for these scripts. She is wanting a 3 month supply of her Flonase.   Which pharmacy would you like this sent to? Pharmacy  [address], [place], [address], [address], [place] [address]  Phone:  [phone] Fax:  [phone]      Patient notified that their request is being sent to the clinical staff for review and that they should receive a call once it is complete. If they do not receive a call within 72 hours they can check with their pharmacy or our office.

## 2020-04-28 NOTE — Telephone Encounter (Signed)
Last OV 11/13/19 Last fill Historical Provider.

## 2020-04-28 NOTE — Addendum Note (Signed)
Addended by: Darral Dash on: 04/28/2020 02:51 PM   Modules accepted: Orders

## 2020-04-28 NOTE — Telephone Encounter (Signed)
She is on an odd combination of pills with HCTZ and furosemide.   Would like for [facility] to see this.    May send in Hctz to a local pharmacy Sig 1 po q d. #30 NR

## 2020-04-28 NOTE — Telephone Encounter (Signed)
Last OV 11/13/19 Last fill 11/13/19  #16g/3 Last fill for,Lopressor 10/15/19  #180/1

## 2020-04-28 NOTE — Telephone Encounter (Signed)
Per Dr. Ethelene Hal She is on an odd combination of pills with HCTZ and furosemide.   Would like for [facility] to see this.    May send in Hctz to a local pharmacy Sig 1 po q d. #30 NR

## 2020-05-04 MED ORDER — FUROSEMIDE 20 MG PO TABS: 20.0000 mg | ORAL_TABLET | Freq: Every day | ORAL | 0 refills | Status: DC | PRN

## 2020-05-04 MED ORDER — FLUTICASONE PROPIONATE 50 MCG/ACT NA SUSP: 2.0000 | Freq: Every day | NASAL | 2 refills | Status: DC

## 2020-05-04 MED ORDER — HYDROCHLOROTHIAZIDE 25 MG PO TABS
25.0000 mg | ORAL_TABLET | Freq: Every day | ORAL | 3 refills | Status: DC
Start: 2020-05-04 — End: 2021-03-14

## 2020-05-04 NOTE — Telephone Encounter (Signed)
Left message on voicemail to call office.  

## 2020-05-04 NOTE — Telephone Encounter (Signed)
Pt informed me that she only takes Lasix PRN.

## 2020-05-04 NOTE — Telephone Encounter (Signed)
Please call pt to confirm if she is taking both HCTZ daily and lasix daily? Or is the lasix PRN?

## 2020-05-04 NOTE — Telephone Encounter (Signed)
Pt called and said that [facility] sent to [facility] to fill, she needs 3 month supply 1. HCTZ 2. Flonase

## 2020-05-04 NOTE — Telephone Encounter (Signed)
90 day supply and 3RF sent to [facility] for HCTZ

## 2020-06-08 ENCOUNTER — Other Ambulatory Visit: Payer: Self-pay

## 2020-06-09 ENCOUNTER — Encounter: Payer: Self-pay | Admitting: Family Medicine

## 2020-06-09 ENCOUNTER — Ambulatory Visit (INDEPENDENT_AMBULATORY_CARE_PROVIDER_SITE_OTHER): Payer: Medicare Other | Admitting: Family Medicine

## 2020-06-09 VITALS — BP 100/70 | HR 63 | Temp 97.4°F | Wt 250.6 lb

## 2020-06-09 DIAGNOSIS — I779 Disorder of arteries and arterioles, unspecified: Secondary | ICD-10-CM | POA: Diagnosis not present

## 2020-06-09 DIAGNOSIS — Z1231 Encounter for screening mammogram for malignant neoplasm of breast: Secondary | ICD-10-CM

## 2020-06-09 DIAGNOSIS — J302 Other seasonal allergic rhinitis: Secondary | ICD-10-CM | POA: Diagnosis not present

## 2020-06-09 DIAGNOSIS — E7800 Pure hypercholesterolemia, unspecified: Secondary | ICD-10-CM | POA: Diagnosis not present

## 2020-06-09 DIAGNOSIS — D229 Melanocytic nevi, unspecified: Secondary | ICD-10-CM

## 2020-06-09 DIAGNOSIS — E559 Vitamin D deficiency, unspecified: Secondary | ICD-10-CM | POA: Diagnosis not present

## 2020-06-09 DIAGNOSIS — Z8639 Personal history of other endocrine, nutritional and metabolic disease: Secondary | ICD-10-CM

## 2020-06-09 DIAGNOSIS — I10 Essential (primary) hypertension: Secondary | ICD-10-CM | POA: Diagnosis not present

## 2020-06-09 DIAGNOSIS — N39498 Other specified urinary incontinence: Secondary | ICD-10-CM

## 2020-06-09 LAB — CBC
HCT: 40.1 % (ref 36.0–46.0)
Hemoglobin: 13.1 g/dL (ref 12.0–15.0)
MCHC: 32.6 g/dL (ref 30.0–36.0)
MCV: 88.3 fl (ref 78.0–100.0)
Platelets: 271 10*3/uL (ref 150.0–400.0)
RBC: 4.55 Mil/uL (ref 3.87–5.11)
RDW: 13.9 % (ref 11.5–15.5)
WBC: 11.1 10*3/uL — ABNORMAL HIGH (ref 4.0–10.5)

## 2020-06-09 MED ORDER — FLUTICASONE PROPIONATE 50 MCG/ACT NA SUSP: 2.0000 | Freq: Every day | NASAL | 2 refills | Status: DC

## 2020-06-09 MED ORDER — OXYBUTYNIN CHLORIDE 5 MG PO TABS: 5.0000 mg | ORAL_TABLET | Freq: Three times a day (TID) | ORAL | 1 refills | Status: DC

## 2020-06-09 MED ORDER — LOSARTAN POTASSIUM 100 MG PO TABS: 100.0000 mg | ORAL_TABLET | Freq: Every day | ORAL | 3 refills | Status: DC

## 2020-06-09 NOTE — Progress Notes (Signed)
Kristin Lambert is a 70 y.o. female  Chief Complaint  Patient presents with   Follow-up    Pt here for 6 monthe f/u.  Pt would like referral for mammogram.  Pt c/o blister on the back of rt thigh and mole on lt side of neck.    HPI: Kristin Lambert is a 70 y.o. female seen today for routine f/u on chronic medical issues.  She is due for mammo and requests referral. She has a mole on the Lt side of her neck that she would like evaluated. She also has a mole or other growth on the back of her Rt thigh.  She complains of increased issues with urinary incontinence. Now wearing pad daily vs only when leaving the house. Would like to discuss med options. She needs a refill of her flonase. Denies HA, dizziness, vision changes, CP, SOB, palpitations, LE edema.   Past Medical History:  Diagnosis Date   Arthritis    Hypertension     Past Surgical History:  Procedure Laterality Date   ABDOMINAL HYSTERECTOMY     BIOPSY THYROID  05/2019   CESAREAN SECTION     CHOLECYSTECTOMY     KNEE REPAIR EXTENSOR MECHANISM Right     Social History   Socioeconomic History   Marital status: Married    Spouse name: Not on file   Number of children: Not on file   Years of education: Not on file   Highest education level: Not on file  Occupational History   Not on file  Tobacco Use   Smoking status: Never Smoker   Smokeless tobacco: Never Used  Vaping Use   Vaping Use: Former  Substance and Sexual Activity   Alcohol use: Never   Drug use: Never   Sexual activity: Not Currently  Other Topics Concern   Not on file  Social History Narrative   Not on file   Social Determinants of Health   Financial Resource Strain: Low Risk    Difficulty of Paying Living Expenses: Not hard at all  Food Insecurity: No Food Insecurity   Worried About Charity fundraiser in the Last Year: Never true   [place] in the Last Year: Never true  Transportation Needs: No Transportation Needs   Lack of  Transportation (Medical): No   Lack of Transportation (Non-Medical): No  Physical Activity: Inactive   Days of Exercise per Week: 0 days   Minutes of Exercise per Session: 0 min  Stress: Stress Concern Present   Feeling of Stress : To some extent  Social Connections: Moderately Integrated   Frequency of Communication with Friends and Family: More than three times a week   Frequency of Social Gatherings with Friends and Family: Twice a week   Attends Religious Services: 1 to 4 times per year   Active Member of [employer] or Organizations: No   Attends Archivist Meetings: Never   Marital Status: Married  Human resources officer Violence: Not At Risk   Fear of Current or Ex-Partner: No   Emotionally Abused: No   Physically Abused: No   Sexually Abused: No    History reviewed. No pertinent family history.   Immunization History  Administered Date(s) Administered   PFIZER(Purple Top)SARS-COV-2 Vaccination 03/19/2019, 04/11/2019, 11/06/2019    Outpatient Encounter Medications as of 06/09/2020  Medication Sig   acetaminophen (TYLENOL) 500 MG tablet Take 500 mg by mouth every 6 (six) hours as needed.   amLODipine (NORVASC) 2.5 MG tablet TAKE 1  TABLET BY MOUTH  DAILY   Ascorbic Acid (VITAMIN C) 100 MG tablet Take 100 mg by mouth daily.   aspirin EC 81 MG tablet Take 81 mg by mouth daily.   cholecalciferol (VITAMIN D3) 25 MCG (1000 UNIT) tablet Take 1,000 Units by mouth daily.   furosemide (LASIX) 20 MG tablet Take 1 tablet (20 mg total) by mouth daily as needed.   hydrochlorothiazide (HYDRODIURIL) 25 MG tablet Take 1 tablet (25 mg total) by mouth daily.   metoprolol tartrate (LOPRESSOR) 100 MG tablet Take 1 tablet (100 mg total) by mouth 2 (two) times daily.   oxybutynin (DITROPAN) 5 MG tablet Take 1 tablet (5 mg total) by mouth 2 (two) times daily.   potassium chloride (KLOR-CON) 10 MEQ tablet Take 1 tablet (10 mEq total) by mouth daily.   simvastatin (ZOCOR) 40 MG tablet Take 1 tablet  (40 mg total) by mouth at bedtime.   [DISCONTINUED] fluticasone (FLONASE) 50 MCG/ACT nasal spray Place 2 sprays into both nostrils daily.   [DISCONTINUED] losartan (COZAAR) 100 MG tablet Take 100 mg by mouth daily.   fluticasone (FLONASE) 50 MCG/ACT nasal spray Place 2 sprays into both nostrils daily.   losartan (COZAAR) 100 MG tablet Take 1 tablet (100 mg total) by mouth daily.   No facility-administered encounter medications on file as of 06/09/2020.     ROS: Pertinent positives and negatives noted in HPI. Remainder of ROS non-contributory    Allergies  Allergen Reactions   Nsaids     BP 100/70 (BP Location: Left Arm, Patient Position: Sitting, Cuff Size: Large)   Pulse 63   Temp (!) 97.4 F (36.3 C) (Temporal)   Wt 250 lb 9.6 oz (113.7 kg)   SpO2 95%   BMI 45.84 kg/m   Physical Exam Constitutional:      General: She is not in acute distress.    Appearance: She is obese.  Cardiovascular:     Rate and Rhythm: Normal rate and regular rhythm.     Pulses: Normal pulses.  Pulmonary:     Effort: Pulmonary effort is normal. No respiratory distress.     Breath sounds: No wheezing or rhonchi.  Musculoskeletal:     Cervical back: Normal range of motion.     Right lower leg: No edema.     Left lower leg: No edema.  Lymphadenopathy:     Cervical: No cervical adenopathy.  Skin:    Comments: Fleshy growth on Rt posterior thigh, appears to be a skin tag Pt also with nevi on Lt lateral neck  Neurological:     Mental Status: She is alert and oriented to person, place, and time.  Psychiatric:        Mood and Affect: Mood normal.        Behavior: Behavior normal.     A/P:  1. Seasonal allergies - currently stable, at baseline Refill: - fluticasone (FLONASE) 50 MCG/ACT nasal spray; Place 2 sprays into both nostrils daily.  Dispense: 16 g; Refill: 2  2. Encounter for screening mammogram for malignant neoplasm of breast - MM DIGITAL SCREENING BILATERAL; Future  3.  Essential hypertension - controlled, at goal - cont metoprolol 100mg  BID, amlodipine 2.5mg  daily, losartan 100mg  daily, HCTZ 25mg  daily Refill: - losartan (COZAAR) 100 MG tablet; Take 1 tablet (100 mg total) by mouth daily.  Dispense: 90 tablet; Refill: 3 - Comprehensive metabolic panel - CBC  4. History of thyroid nodule - not on thyroid medication - US THYROID; Future - TSH  5. Carotid artery disease, unspecified laterality, unspecified type (HCC) - VAS US CAROTID; Future  6. Other urinary incontinence - chronic but worsening Rx: - oxybutynin (DITROPAN) 5 MG tablet; Take 1 tablet (5 mg total) by mouth 3 (three) times daily.  Dispense: 270 tablet; Refill: 1 - f/u in 4-6 wks if no/minimal improvement  7. Multiple nevi - Ambulatory referral to Dermatology  8. Vitamin D deficiency - takes OTC Vit D 1000IU daily - VITAMIN D 25 Hydroxy (Vit-D Deficiency, Fractures)  9. Elevated cholesterol - on zocor 40mg  daily, cont at this time - Lipid panel  Discussed plan and reviewed medications with patient, including risks, benefits, and potential side effects. Pt expressed understand. All questions answered.   This visit occurred during the SARS-CoV-2 public health emergency.  Safety protocols were in place, including screening questions prior to the visit, additional usage of staff PPE, and extensive cleaning of exam room while observing appropriate contact time as indicated for disinfecting solutions.

## 2020-06-10 LAB — COMPREHENSIVE METABOLIC PANEL
ALT: 10 U/L (ref 0–35)
AST: 12 U/L (ref 0–37)
Albumin: 4.4 g/dL (ref 3.5–5.2)
Alkaline Phosphatase: 67 U/L (ref 39–117)
BUN: 28 mg/dL — ABNORMAL HIGH (ref 6–23)
CO2: 24 mEq/L (ref 19–32)
Calcium: 9.5 mg/dL (ref 8.4–10.5)
Chloride: 105 mEq/L (ref 96–112)
Creatinine, Ser: 1.08 mg/dL (ref 0.40–1.20)
GFR: 51.96 mL/min — ABNORMAL LOW (ref >60.00)
Glucose, Bld: 107 mg/dL — ABNORMAL HIGH (ref 70–99)
Potassium: 4 mEq/L (ref 3.5–5.1)
Sodium: 139 mEq/L (ref 135–145)
Total Bilirubin: 0.6 mg/dL (ref 0.2–1.2)
Total Protein: 6.9 g/dL (ref 6.0–8.3)

## 2020-06-10 LAB — LIPID PANEL
Cholesterol: 118 mg/dL (ref 0–200)
HDL: 40.5 mg/dL (ref >39.00)
LDL Cholesterol: 44 mg/dL (ref 0–99)
NonHDL: 77.18
Total CHOL/HDL Ratio: 3
Triglycerides: 165 mg/dL — ABNORMAL HIGH (ref 0.0–149.0)
VLDL: 33 mg/dL (ref 0.0–40.0)

## 2020-06-10 LAB — TSH: TSH: 1.25 u[IU]/mL (ref 0.35–4.50)

## 2020-06-10 LAB — VITAMIN D 25 HYDROXY (VIT D DEFICIENCY, FRACTURES): VITD: 28.15 ng/mL — ABNORMAL LOW (ref 30.00–100.00)

## 2020-06-22 ENCOUNTER — Encounter (HOSPITAL_COMMUNITY): Payer: Medicare Other

## 2020-06-24 ENCOUNTER — Encounter: Payer: Self-pay | Admitting: Family Medicine

## 2020-06-29 ENCOUNTER — Other Ambulatory Visit (HOSPITAL_COMMUNITY): Payer: Self-pay | Admitting: Family Medicine

## 2020-06-29 ENCOUNTER — Ambulatory Visit (HOSPITAL_COMMUNITY)
Admission: RE | Admit: 2020-06-29 | Discharge: 2020-06-29 | Disposition: A | Discharge status: Home / Self Care | Payer: Medicare Other | Source: Ambulatory Visit | Attending: Cardiovascular Disease | Admitting: Cardiovascular Disease

## 2020-06-29 ENCOUNTER — Other Ambulatory Visit: Payer: Self-pay

## 2020-06-29 DIAGNOSIS — I6521 Occlusion and stenosis of right carotid artery: Secondary | ICD-10-CM

## 2020-06-29 DIAGNOSIS — I779 Disorder of arteries and arterioles, unspecified: Secondary | ICD-10-CM | POA: Insufficient documentation

## 2020-07-13 ENCOUNTER — Other Ambulatory Visit: Payer: Medicare Other

## 2020-08-10 ENCOUNTER — Ambulatory Visit
Admission: RE | Admit: 2020-08-10 | Discharge: 2020-08-10 | Disposition: A | Discharge status: Home / Self Care | Payer: Medicare Other | Source: Ambulatory Visit | Attending: Family Medicine | Admitting: Family Medicine

## 2020-08-10 DIAGNOSIS — Z8639 Personal history of other endocrine, nutritional and metabolic disease: Secondary | ICD-10-CM

## 2020-08-10 DIAGNOSIS — E042 Nontoxic multinodular goiter: Secondary | ICD-10-CM | POA: Diagnosis not present

## 2020-08-11 ENCOUNTER — Other Ambulatory Visit: Payer: Self-pay | Admitting: Family Medicine

## 2020-08-11 DIAGNOSIS — Z8639 Personal history of other endocrine, nutritional and metabolic disease: Secondary | ICD-10-CM

## 2020-08-17 ENCOUNTER — Telehealth: Payer: Self-pay

## 2020-08-17 NOTE — Telephone Encounter (Signed)
Called and spoke to patient regarding results from thyroid US per Dr. Letta Median. Patient became upset and stated "she wasn't going to see another physician until someone tell her why she needs to keep getting a biopsy done." Explain to patient she can discuss further with ENT specialist she was referred to, pt stated "she declined to schedule an appt when the referral specialist reached out." Explained to patient Dr. Bryan Lemma will no longer be at the office and will see if another provider can speak to her regarding her concerns. sw

## 2020-08-25 ENCOUNTER — Telehealth: Payer: Self-pay

## 2020-08-25 NOTE — Telephone Encounter (Signed)
Called patient and updated encounter. Sw, cma

## 2020-08-26 NOTE — Telephone Encounter (Signed)
Called Kristin Lambert to discuss Dr. Bryan Lemma recommendation for thyroid US. Ms. Gutter informed me she has a hx of thyroid nodule. First noted 03/2019 while in [place], [place]. She reports a thyroid biopsy was performed 04/2019 by [facility] in [place], [place]. She states pathology was benign. She does not want another thyroid biopsy unless it is necessary. I informed her that we do not have records from [facility], nor do we have any previous thyroid US to compare to current [place] results. She requested for records to be obtained from [facility]. She provided the following numbers: Fax [phone] Phone number [phone]. She stated a sign medical release form was not needed. I informed her that [place] [facility] will call the number above for records. If we are not able to get records, she will need to come to office to sign a release form for [facility].

## 2020-09-07 ENCOUNTER — Other Ambulatory Visit: Payer: Self-pay

## 2020-09-07 ENCOUNTER — Telehealth: Payer: Self-pay

## 2020-09-07 DIAGNOSIS — I10 Essential (primary) hypertension: Secondary | ICD-10-CM

## 2020-09-07 NOTE — Telephone Encounter (Signed)
Received request for refills from [facility] for the following meds: Simvastatin 40 mg LR 03/23/20#90, 1 rf  Metoprolol Tart 100 mg LR 04/28/20, #180, 0 rf LOV  06/09/20 FOV  10/18/20 (Dr Gena Fray).  Last meds sent to local pharmacy.left VM to rtn call to see if they are wanting this.  Dm/cma

## 2020-09-09 ENCOUNTER — Other Ambulatory Visit: Payer: Self-pay

## 2020-09-09 DIAGNOSIS — I10 Essential (primary) hypertension: Secondary | ICD-10-CM

## 2020-09-09 MED ORDER — METOPROLOL TARTRATE 100 MG PO TABS: 100.0000 mg | ORAL_TABLET | Freq: Two times a day (BID) | ORAL | 0 refills | Status: DC

## 2020-09-09 MED ORDER — SIMVASTATIN 40 MG PO TABS: 40.0000 mg | ORAL_TABLET | Freq: Every evening | ORAL | 0 refills | Status: DC

## 2020-09-15 NOTE — Telephone Encounter (Signed)
Spoke to patient and advised that rx was sent to the pharmacy.  Dm/cma

## 2020-09-15 NOTE — Telephone Encounter (Signed)
Rx sent to pharmacy on 09/09/20.  Will call patient.

## 2020-09-15 NOTE — Telephone Encounter (Signed)
Pt called back confirming these medications.

## 2020-09-16 ENCOUNTER — Other Ambulatory Visit: Payer: Self-pay | Admitting: Family Medicine

## 2020-09-16 DIAGNOSIS — Z1231 Encounter for screening mammogram for malignant neoplasm of breast: Secondary | ICD-10-CM

## 2020-09-23 ENCOUNTER — Telehealth: Payer: Self-pay | Admitting: Nurse Practitioner

## 2020-09-23 NOTE — Telephone Encounter (Signed)
Thyroid biopsy done 04/30/2019: L. Thyroid (benign follicular cells mixed with colloid in rare hemosiderin laden macrophages, no atypia identified [place] catergory 2). R.thyroid (benign follicular cells mixed with some rare hemosiderin laden macrophages and scant colloid, no atypia identified ([place] catergory 2).

## 2020-09-23 NOTE — Telephone Encounter (Signed)
Called [facility] and lvm to request thyroid US and biopsy results. SW, CMA

## 2020-09-27 ENCOUNTER — Telehealth: Payer: Self-pay | Admitting: Family Medicine

## 2020-09-27 NOTE — Chronic Care Management (AMB) (Signed)
  Chronic Care Management   Note  09/27/2020 Name: Kristin Lambert MRN: [id number] DOB: 06-Mar-1950  Kristin Lambert is a 70 y.o. year old female who is a primary care patient of No primary care provider on file.. I reached out to Loni Beckwith by phone today in response to a referral sent by [name] PCP, No primary care provider on file..   [name] was given information about Chronic Care Management services today including:  CCM service includes personalized support from designated clinical staff supervised by her physician, including individualized plan of care and coordination with other care providers 24/7 contact phone numbers for assistance for urgent and routine care needs. Service will only be billed when office clinical staff spend 20 minutes or more in a month to coordinate care. Only one practitioner may furnish and bill the service in a calendar month. The patient may stop CCM services at any time (effective at the end of the month) by phone call to the office staff.   Patient agreed to services and verbal consent obtained.   Follow up plan:   [name]

## 2020-10-03 NOTE — Telephone Encounter (Signed)
Thyroid US report reviewed, completed 04/21/2019: R. Lower lobe (2.0x1.9x1.7cm) and Left mid lobe (2.1x1.6x1.6cm) thyroid nodules were biopsied. No cervical lymphadenopathy. These are the same nodules recommended for biopsy on 08/10/2020 [place] report. Right lower lobe (2.3x1.9x1.5cm) and left mid lobe (2.2x2.0x1.6cm) appear slightly larger. With recent benign pathology, I recommend you discuss need for repeat biopsy with Dr. Gena Fray. Report sent to be scanned in Chart.

## 2020-10-17 ENCOUNTER — Other Ambulatory Visit: Payer: Self-pay

## 2020-10-18 ENCOUNTER — Ambulatory Visit (INDEPENDENT_AMBULATORY_CARE_PROVIDER_SITE_OTHER): Payer: Medicare Other | Admitting: Family Medicine

## 2020-10-18 ENCOUNTER — Encounter: Payer: Self-pay | Admitting: Family Medicine

## 2020-10-18 VITALS — BP 114/76 | HR 64 | Temp 97.8°F | Ht 62.0 in | Wt 245.0 lb

## 2020-10-18 DIAGNOSIS — R600 Localized edema: Secondary | ICD-10-CM | POA: Diagnosis not present

## 2020-10-18 DIAGNOSIS — G5601 Carpal tunnel syndrome, right upper limb: Secondary | ICD-10-CM | POA: Diagnosis not present

## 2020-10-18 DIAGNOSIS — Z23 Encounter for immunization: Secondary | ICD-10-CM

## 2020-10-18 DIAGNOSIS — M19041 Primary osteoarthritis, right hand: Secondary | ICD-10-CM

## 2020-10-18 DIAGNOSIS — J309 Allergic rhinitis, unspecified: Secondary | ICD-10-CM | POA: Insufficient documentation

## 2020-10-18 DIAGNOSIS — G5602 Carpal tunnel syndrome, left upper limb: Secondary | ICD-10-CM | POA: Insufficient documentation

## 2020-10-18 DIAGNOSIS — E782 Mixed hyperlipidemia: Secondary | ICD-10-CM | POA: Diagnosis not present

## 2020-10-18 DIAGNOSIS — I10 Essential (primary) hypertension: Secondary | ICD-10-CM | POA: Diagnosis not present

## 2020-10-18 DIAGNOSIS — M8580 Other specified disorders of bone density and structure, unspecified site: Secondary | ICD-10-CM | POA: Diagnosis not present

## 2020-10-18 DIAGNOSIS — Z6841 Body Mass Index (BMI) 40.0 and over, adult: Secondary | ICD-10-CM

## 2020-10-18 DIAGNOSIS — E042 Nontoxic multinodular goiter: Secondary | ICD-10-CM | POA: Insufficient documentation

## 2020-10-18 DIAGNOSIS — N3946 Mixed incontinence: Secondary | ICD-10-CM | POA: Insufficient documentation

## 2020-10-18 DIAGNOSIS — N393 Stress incontinence (female) (male): Secondary | ICD-10-CM | POA: Insufficient documentation

## 2020-10-18 NOTE — Patient Instructions (Signed)

## 2020-10-18 NOTE — Progress Notes (Signed)
[facility] [address] [place] [address] Dept: [phone] Dept Fax: [phone]  Transfer of Care Office Visit  Subjective:    Patient ID: Kimbree Casanas, female    DOB: 06-14-1950, 70 y.o..   MRN: [id number]  Chief Complaint  Patient presents with   [place]- establish care.  C/o having pain in the RT middle finger along with numbness in fingers.  She has used [place] with some relief.  Wants flu shot.     History of Present Illness:  Patient is in today to establish care. Ms. Sedivy was born in [place]. She moved with her husband of 79 years Dominica Severin) to [place] 2 years ago to be closer to her daughter Nira Conn). Mr. Doepke has advanced Alzhiermers disease and requires quite a bit of assistance. 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 She eventually stopped work due to disability related to obesity and knee arthritis. She denies use of tobacco, alcohol, or drugs.  Ms. Soller has a history fo hypertension, managed with amlodipine, losartan, and HCTZ. She is also on some occasional Lasix related to pedal edema. She notes that has been an issue ever since her total right knee joint replacement.  Ms. Curvin has a history of osteoarthritis of both knee joints. She only ever had the right knee joint replaced. She did have trouble with wound healing afterwards (wound dehiscence requiring a Wound Vac), so the decision was made to not pursue the left knee joint. She does continue to have pain. However, she has been able to gradually lose weight. She found Weight Watchers ot help with this. She did weight >400 lbs. at one point.  Ms. Goodroe has a history of hyperlipidemia and is managed on simvastatin. Ms. maddox bratcher a history of a right internal carotid artery occlusion. She notes she gets an ultrasound yearly to monitor this.   Ms. Pruden notes she has recently had an issue with swelling and pain in the right  3rd knuckle. Additionally, she has had issue with numbness and tingling in her right 2nd and 3rd fingers. She notes she does do a bit of lifting and pulling, as she has to help her husband with all of his ADLs and IADLs.  Ms. Thresher has a history of multiple thyroid nodules. She had these biopsied earlier this year and they are reportedly benign.  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 10/18/2020   Morbid obesity with BMI of 45.0-49.9, adult ([place]) 11/13/2019   Vitamin D deficiency 07/30/2019   Hyperlipidemia 07/30/2019   Osteoarthritis of knees, bilateral 06/19/2019   Essential hypertension 06/19/2019   Past Surgical History:  Procedure Laterality Date   ABDOMINAL HYSTERECTOMY     BIOPSY THYROID  05/2019   CESAREAN SECTION     CHOLECYSTECTOMY     KNEE REPAIR EXTENSOR MECHANISM Right    Family History  Problem Relation Age of Onset   Stroke Mother    Heart disease Mother    Heart disease Sister    Outpatient Medications Prior to Visit  Medication Sig Dispense Refill   acetaminophen (TYLENOL) 500 MG tablet Take 650 mg by mouth every 6 (six) hours as needed.     amLODipine (NORVASC) 2.5 MG tablet TAKE 1  TABLET BY MOUTH  DAILY 90 tablet 3   Ascorbic Acid (VITAMIN C) 100 MG tablet Take 100 mg by mouth daily.     aspirin EC 81 MG tablet Take 81 mg by mouth daily.     cholecalciferol (VITAMIN D3) 25 MCG (1000 UNIT) tablet Take 1,000 Units by mouth daily.     fluticasone (FLONASE) 50 MCG/ACT nasal spray Place 2 sprays into both nostrils daily. 16 g 2   furosemide (LASIX) 20 MG tablet Take 1 tablet (20 mg total) by mouth daily as needed. 30 tablet 0   hydrochlorothiazide (HYDRODIURIL) 25 MG tablet Take 1 tablet (25 mg total) by mouth daily. 90 tablet 3   losartan  (COZAAR) 100 MG tablet Take 1 tablet (100 mg total) by mouth daily. 90 tablet 3   metoprolol tartrate (LOPRESSOR) 100 MG tablet Take 1 tablet (100 mg total) by mouth 2 (two) times daily. 180 tablet 0   Multiple Vitamin (MULTIVITAMIN) tablet Take 1 tablet by mouth daily.     oxybutynin (DITROPAN) 5 MG tablet Take 1 tablet (5 mg total) by mouth 3 (three) times daily. 270 tablet 1   potassium chloride (KLOR-CON) 10 MEQ tablet Take 1 tablet (10 mEq total) by mouth daily. 90 tablet 0   simvastatin (ZOCOR) 40 MG tablet Take 1 tablet (40 mg total) by mouth at bedtime. 90 tablet 0   vitamin E 1000 UNIT capsule Take 1,000 Units by mouth daily.     No facility-administered medications prior to visit.   Allergies  Allergen Reactions   Nsaids    Objective:   Today's Vitals   10/18/20 1311  BP: 114/76  Pulse: 64  Temp: 97.8 F (36.6 C)  TempSrc: Temporal  SpO2: 97%  Weight: 245 lb (111.1 kg)  Height: 5\' 2"  (1.575 m)   Body mass index is 44.81 kg/m.   General: Well developed, well nourished. No acute distress. Extremities: The right 3rd MCP joint is swollen and mildly warm without significant redness. There   is pan with motion. There is a + Tinel's and + Phalen's test on the right wrist. Psych: Alert and oriented. Normal mood and affect.  Health Maintenance Due  Topic Date Due   Hepatitis C Screening  Never done   TETANUS/TDAP  Never done   COLONOSCOPY (Pts 45-50yrs Insurance coverage will need to be confirmed)  Never done   MAMMOGRAM  Never done   Zoster Vaccines- Shingrix (1 of 2) Never done   DEXA SCAN  Never done   COVID-19 Vaccine (4 - Booster for Pfizer series) 03/08/2020   INFLUENZA VACCINE  Never done   Lab Results Lab Results  Component Value Date   CHOL 118 06/09/2020   HDL 40.50 06/09/2020   LDLCALC 44 06/09/2020   LDLDIRECT 47.0 07/30/2019   TRIG 165.0 (H) 06/09/2020   CHOLHDL 3 06/09/2020   BMP Latest Ref Rng & Units 06/09/2020 07/30/2019  Glucose 70 - 99 mg/dL  107(H) 95  BUN 6 - 23 mg/dL 28(H) 28(H)  Creatinine 0.40 - 1.20 mg/dL 1.08 0.98  Sodium 135 - 145 mEq/L 139 139  Potassium 3.5 - 5.1 mEq/L 4.0 4.0  Chloride 96 - 112 mEq/L 105 105  CO2 19 - 32 mEq/L 24 25  Calcium 8.4 - 10.5 mg/dL 9.5 9.5   Assessment & Plan:   1. Essential hypertension Blood pressure is at goal. Continue amlodipine, losartan, HCTZ, and metoprolol. I will reassess in 3 months.  2. Mixed hyperlipidemia Lipids have been at goal on  simvastatin. We will plan to check fasting labs at her next visit.  3. Stress incontinence Continue oxybutynin.  4. Morbid obesity with BMI of 45.0-49.9, adult [facility]) Ms. Spoon has had > 150 lb weight loss. I congratulated her on her efforts.  5. Pedal edema We will continue occasional lasix for edema issues.  6. Osteopenia, unspecified location Ms. Frede is past due for a repeat DXA scan.  - DG Bone Density; Future  7. Carpal tunnel syndrome of right wrist Ms. Safer's exam is consistent with mild carpal tunnel syndrome. She is not able to use NSAIDs. I recommend she obtain a wrist splint to see if this will help to reduce her symptoms.  8. 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 Asberry appears to have some arthritis of the 3rd MCP joint. I recommend she try OTC Voltaren gel. I am doubtful that the topical NSAID will cause her trouble. She should also do hot soaks with ROM. If not improving, I will consider referral to a hand specialist, possibly for an intraarticular steroid injection.  Haydee Salter, MD

## 2020-10-20 ENCOUNTER — Other Ambulatory Visit: Payer: Self-pay

## 2020-10-20 DIAGNOSIS — N39498 Other specified urinary incontinence: Secondary | ICD-10-CM

## 2020-10-20 MED ORDER — OXYBUTYNIN CHLORIDE 5 MG PO TABS: 5.0000 mg | ORAL_TABLET | Freq: Three times a day (TID) | ORAL | 1 refills | Status: DC

## 2020-10-25 ENCOUNTER — Telehealth: Payer: Self-pay | Admitting: Family Medicine

## 2020-10-25 DIAGNOSIS — G5601 Carpal tunnel syndrome, right upper limb: Secondary | ICD-10-CM

## 2020-10-25 NOTE — Telephone Encounter (Signed)
Pt was seen with you on 10/18/20 for her carpal tunnel issue. She is using the Voltaren cream and a hand brace plus a heating pad. She is still having her fingers go numb and ache. She is wanting a referral to carpal tunnel specialist. Please advise pt [phone].

## 2020-10-28 ENCOUNTER — Encounter: Payer: Self-pay | Admitting: Neurology

## 2020-10-28 ENCOUNTER — Telehealth: Payer: Self-pay | Admitting: Family Medicine

## 2020-10-28 NOTE — Telephone Encounter (Signed)
Pt is wanting Dr. Gena Fray to know the referral placed for her to [facility] can not see her until January. She has found [facility] Dr. Tempie Donning that can see her next week, so she will not be going to [facility].

## 2020-10-28 NOTE — Telephone Encounter (Signed)
Patient notified [facility] phone.  And neurology can't see her till January.   She got an appt with Ortho with out needing a referral.  Dm/cma

## 2020-10-31 ENCOUNTER — Other Ambulatory Visit: Payer: Self-pay

## 2020-10-31 ENCOUNTER — Encounter: Payer: Self-pay | Admitting: Orthopedic Surgery

## 2020-10-31 ENCOUNTER — Ambulatory Visit: Payer: Medicare Other | Admitting: Orthopedic Surgery

## 2020-10-31 DIAGNOSIS — R200 Anesthesia of skin: Secondary | ICD-10-CM | POA: Insufficient documentation

## 2020-10-31 DIAGNOSIS — R202 Paresthesia of skin: Secondary | ICD-10-CM

## 2020-10-31 NOTE — Progress Notes (Signed)
Office Visit Note   Patient: Kristin Lambert           Date of Birth: January 18, 1950           MRN: [id number] Visit Date: 10/31/2020              Requested by: Haydee Salter, MD [place],  [place] [address] PCP: Haydee Salter, MD   Assessment & Plan: Visit Diagnoses:  1. Numbness and tingling in right hand     Plan: Discussed with patient that her symptoms are most consistent with carpal tunnel syndrome.  It is interesting that her symptoms only started a month ago after increased activity.  We discussed both nonoperative and operative treatment options for carpal tunnel syndrome.  After our discussion, we will refer her to Dr. Ernestina Patches for an EMG/NCS and have her continue to wear her wrist braces, particularly at night.  Follow-Up Instructions: No follow-ups on file.   Orders:  No orders of the defined types were placed in this encounter.  No orders of the defined types were placed in this encounter.     Procedures: No procedures performed   Clinical Data: No additional findings.   Subjective: Chief Complaint  Patient presents with   Right Hand - Pain, Numbness    This is a 70 yo RHD F who presents with new onset of numbness and tingling in her thumb, index, and middle finger.  Her symptoms started approximate 1 month ago after she was putting together some shelving with a screwdriver.  She denies any symptoms prior to that.  She says that she wakes up every night with numbness and tingling in these fingers.  She has no history of diabetes, thyroid disease, C-spine problem, or wrist trauma.  She has worn a wrist brace which has helped some.  She is also tried Voltaren gel which also has helped.  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   Review of Systems   Objective: Vital Signs: BP 130/71 (BP Location: Left Arm, Patient Position: Sitting)   Pulse 93   Ht 5\' 2"  (1.575 m)   Wt 245 lb (111.1  kg)   BMI 44.81 kg/m   Physical Exam Constitutional:      Appearance: Normal appearance.  Cardiovascular:     Rate and Rhythm: Normal rate.     Pulses: Normal pulses.  Pulmonary:     Effort: Pulmonary effort is normal.  Skin:    General: Skin is warm.     Capillary Refill: Capillary refill takes less than 2 seconds.  Neurological:     Mental Status: She is alert.    Right Hand Exam   Tenderness  The patient is experiencing no tenderness.   Range of Motion  The patient has normal right wrist ROM.   Muscle Strength  The patient has normal right wrist strength.  Other  Erythema: absent Sensation: decreased Pulse: present  Comments:  + Tinel at wrist w/ electrical sensation into thumb and index finger.  + Carpal tunnel compression test.  No obvious thenar atrophy but does seem weaker compared to contralateral side.      Specialty Comments:  No specialty comments available.  Imaging: No results found.   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 10/18/2020   Multiple thyroid nodules 10/18/2020   Morbid obesity with BMI of 45.0-49.9, adult ([place]) 11/13/2019   Vitamin D deficiency 07/30/2019   Hyperlipidemia 07/30/2019   Osteoarthritis of knees, bilateral 06/19/2019   Essential hypertension 06/19/2019   Past Medical History:  Diagnosis Date   Arthritis    Hypertension     Family History  Problem Relation Age of Onset   Stroke Mother    Heart disease Mother    Heart disease Sister     Past Surgical History:  Procedure Laterality Date   ABDOMINAL HYSTERECTOMY     BIOPSY THYROID  05/2019   CESAREAN SECTION     CHOLECYSTECTOMY     KNEE REPAIR EXTENSOR MECHANISM Right    Social History    Occupational History   Occupation: Retired- Nursing    Comment: [place], [place]  Tobacco Use   Smoking status: Never   Smokeless tobacco: Never  Vaping Use   Vaping Use: Former  Substance and Sexual Activity   Alcohol use: Never   Drug use: Never   Sexual activity: Not Currently

## 2020-11-01 ENCOUNTER — Other Ambulatory Visit (HOSPITAL_BASED_OUTPATIENT_CLINIC_OR_DEPARTMENT_OTHER): Payer: Self-pay | Admitting: Family Medicine

## 2020-11-01 DIAGNOSIS — I6521 Occlusion and stenosis of right carotid artery: Secondary | ICD-10-CM

## 2020-11-02 ENCOUNTER — Ambulatory Visit
Admission: RE | Admit: 2020-11-02 | Discharge: 2020-11-02 | Disposition: A | Discharge status: Home / Self Care | Payer: Medicare Other | Source: Ambulatory Visit | Attending: Family Medicine | Admitting: Family Medicine

## 2020-11-02 ENCOUNTER — Other Ambulatory Visit: Payer: Self-pay

## 2020-11-02 DIAGNOSIS — Z1231 Encounter for screening mammogram for malignant neoplasm of breast: Secondary | ICD-10-CM

## 2020-11-03 ENCOUNTER — Other Ambulatory Visit: Payer: Self-pay | Admitting: Radiology

## 2020-11-03 ENCOUNTER — Telehealth: Payer: Self-pay | Admitting: Orthopedic Surgery

## 2020-11-03 DIAGNOSIS — R200 Anesthesia of skin: Secondary | ICD-10-CM

## 2020-11-03 NOTE — Telephone Encounter (Signed)
I called and patient she states that she had not heard anything about her NCS, I advised that Dr. Romona Curls staff will call her and get her scheduled. That it may take up to a week to get a call back. She states that was fine.

## 2020-11-03 NOTE — Telephone Encounter (Signed)
Pt called about referral to [place].   CB [phone]

## 2020-11-04 ENCOUNTER — Ambulatory Visit: Payer: Medicare Other | Admitting: Orthopedic Surgery

## 2020-11-04 ENCOUNTER — Telehealth: Payer: Self-pay

## 2020-11-04 NOTE — Progress Notes (Signed)
Chronic Care Management Pharmacy Assistant   Name: Kristin Lambert  MRN: [id number] DOB: Jun 20, 1950  Chart Review for clinical pharmacist on 11/10/2020.  Conditions to be addressed/monitored: HTN, HLD, Allergic Rhinitis, Osteoarthritis, and Multiple thyroid nodules,Vitamin D deficiency, Stress incontinence  Primary concerns for visit include: Overall health and hypertension.   Recent office visits:  10/25/2020 Dr.Rudd MD (PCP) refer place for Neurology (Patient decided to decline since first appointment would be in January) 10/18/2020 Dr.Rudd MD (PCP) No medication changes noted, return in 3 months 06/09/2020 Letta Median DO (PCP Office) Increase Oxybutynin to 5 mg 3 times daily, Ambulatory referral to Dermatology  Recent consult visits:  10/31/2020 Dr.Benfield MD (Orthopedic Surgery) No medication changes noted   Hospital visits:  None in previous 6 months  Have you seen any other providers since your last visit?   Patient denies seeing any other providers since her last visit.  Any changes in your medications or health?   Patient denies any changes with her medication or health.  Any side effects from any medications?   Patient denies any side effects from her medications.  Do you have an symptoms or problems not managed by your medications?   Patient denies any issue that are not manage by her medications.  Any concerns about your health right now?   Patient states she has been dealing with elevated blood pressure for a very long time now.Patient states she has "adapted to it and is very knowledgeable about it".  Has your provider asked that you check blood pressure, blood sugar, or follow special diet at home? Yes  Patient reports she checks her blood pressure at home.  Do you get any type of exercise on a regular basis?   None ID  Can you think of a goal you would like to reach for your health?   Patient will inform the clinical pharmacist.  Do you have any  problems getting your medications?   Patient denies any problems getting her medications.Patient reports her cost of all her medications are $0.00.  Is there anything that you would like to discuss during the appointment?   Overall health and hypertension.  Please bring medications and supplements to appointment  Patient is aware to have all her medication and supplements at her appointment.  Medications: Outpatient Encounter Medications as of 11/04/2020  Medication Sig   acetaminophen (TYLENOL) 500 MG tablet Take 650 mg by mouth every 6 (six) hours as needed.   amLODipine (NORVASC) 2.5 MG tablet TAKE 1 TABLET BY MOUTH  DAILY   Ascorbic Acid (VITAMIN C) 100 MG tablet Take 100 mg by mouth daily.   aspirin EC 81 MG tablet Take 81 mg by mouth daily.   cholecalciferol (VITAMIN D3) 25 MCG (1000 UNIT) tablet Take 1,000 Units by mouth daily.   fluticasone (FLONASE) 50 MCG/ACT nasal spray Place 2 sprays into both nostrils daily.   furosemide (LASIX) 20 MG tablet Take 1 tablet (20 mg total) by mouth daily as needed.   hydrochlorothiazide (HYDRODIURIL) 25 MG tablet Take 1 tablet (25 mg total) by mouth daily.   losartan (COZAAR) 100 MG tablet Take 1 tablet (100 mg total) by mouth daily.   metoprolol tartrate (LOPRESSOR) 100 MG tablet Take 1 tablet (100 mg total) by mouth 2 (two) times daily.   Multiple Vitamin (MULTIVITAMIN) tablet Take 1 tablet by mouth daily.   oxybutynin (DITROPAN) 5 MG tablet Take 1 tablet (5 mg total) by mouth 3 (three) times daily.   potassium chloride (KLOR-CON)  10 MEQ tablet Take 1 tablet (10 mEq total) by mouth daily.   simvastatin (ZOCOR) 40 MG tablet Take 1 tablet (40 mg total) by mouth at bedtime.   vitamin E 1000 UNIT capsule Take 1,000 Units by mouth daily.   No facility-administered encounter medications on file as of 11/04/2020.    Care Gaps: Hepatitis C Screening  Tetanus Vaccine Colonoscopy  Shingrix Vaccine Pneumonia Vaccie COVID-19 Vaccine (4- Booster  for Pfizer series) Star Rating Drugs: Simvastatin 40 mg last filled 07/07/2020 90 day supply at [facility]. Losartan 100 mg last filled 08/18/2020 90 day supply at [facility]. Medication Fill Gaps: Amlodipine 2.5 MG last filled 07/07/2020 90 day supply at [facility]. Metoprolol tartrate 100 MG  last filled 07/07/2020 90 day supply at [facility]. Potassium chloride 10 MEQ last filled 07/07/2020 90 day supply at [facility].  [place] [place] Pharmacist Assistant [phone]

## 2020-11-09 DIAGNOSIS — B351 Tinea unguium: Secondary | ICD-10-CM | POA: Diagnosis not present

## 2020-11-09 DIAGNOSIS — L84 Corns and callosities: Secondary | ICD-10-CM | POA: Diagnosis not present

## 2020-11-10 ENCOUNTER — Telehealth: Payer: Medicare Other

## 2020-11-18 ENCOUNTER — Ambulatory Visit: Payer: Medicare Other | Admitting: Orthopedic Surgery

## 2020-11-18 ENCOUNTER — Encounter: Payer: Self-pay | Admitting: Physical Medicine and Rehabilitation

## 2020-11-18 ENCOUNTER — Ambulatory Visit (INDEPENDENT_AMBULATORY_CARE_PROVIDER_SITE_OTHER): Payer: Medicare Other | Admitting: Physical Medicine and Rehabilitation

## 2020-11-18 ENCOUNTER — Other Ambulatory Visit: Payer: Self-pay

## 2020-11-18 DIAGNOSIS — R202 Paresthesia of skin: Secondary | ICD-10-CM

## 2020-11-18 NOTE — Progress Notes (Signed)
Right hand pain and numbness. Worse in first 2 fingers. Pain in right wrist and elbow. Right hand dominant No lotion per patient

## 2020-11-19 ENCOUNTER — Emergency Department (HOSPITAL_BASED_OUTPATIENT_CLINIC_OR_DEPARTMENT_OTHER)
Admission: EM | Admit: 2020-11-19 | Discharge: 2020-11-19 | Disposition: A | Discharge status: Home / Self Care | Payer: Medicare Other | Attending: Emergency Medicine | Admitting: Emergency Medicine

## 2020-11-19 ENCOUNTER — Other Ambulatory Visit: Payer: Self-pay

## 2020-11-19 ENCOUNTER — Encounter (HOSPITAL_BASED_OUTPATIENT_CLINIC_OR_DEPARTMENT_OTHER): Payer: Self-pay | Admitting: Emergency Medicine

## 2020-11-19 DIAGNOSIS — Z79899 Other long term (current) drug therapy: Secondary | ICD-10-CM | POA: Diagnosis not present

## 2020-11-19 DIAGNOSIS — G5601 Carpal tunnel syndrome, right upper limb: Secondary | ICD-10-CM | POA: Insufficient documentation

## 2020-11-19 DIAGNOSIS — Z7982 Long term (current) use of aspirin: Secondary | ICD-10-CM | POA: Insufficient documentation

## 2020-11-19 DIAGNOSIS — M25531 Pain in right wrist: Secondary | ICD-10-CM | POA: Diagnosis present

## 2020-11-19 DIAGNOSIS — I10 Essential (primary) hypertension: Secondary | ICD-10-CM | POA: Insufficient documentation

## 2020-11-19 HISTORY — DX: Carpal tunnel syndrome, unspecified upper limb: G56.00

## 2020-11-19 MED ORDER — METHOCARBAMOL 500 MG PO TABS: 500.0000 mg | ORAL_TABLET | Freq: Two times a day (BID) | ORAL | 0 refills | Status: DC | PRN

## 2020-11-19 NOTE — ED Triage Notes (Signed)
Pt c/o pain to right hand onset yesterday after having nerve test. Pt has history of carpal tunnel in right hand. Pt here for pain relief.

## 2020-11-19 NOTE — ED Provider Notes (Signed)
[facility] EMERGENCY DEPARTMENT Provider Note   CSN: [id number] Arrival date & time: 11/19/20  1042     History Chief Complaint  Patient presents with   Hand Pain    Jezabelle Chisolm is a 70 y.o. female.  Patient with history of osteoarthritis presents today with complaint of right hand pain.  She states that same began several weeks ago after she was building a shelf.  She states that pain is cramping in nature throughout her entire right hand and that her thumb and index finger are numb.  She was seen by orthopedics yesterday for the same symptoms who performed nerve studies that revealed that she has carpal tunnel.  Orthopedics recommended RICE, splinting, Tylenol, Voltaren cream, and heat which she has been doing with minimal relief.  She has a follow-up appointment with orthopedics on the 18th, however she states she is unable to sleep due to pain and needs something to manage pain in the interim before she sees orthopedics.  She denies fevers, chills, nausea, vomiting.  The history is provided by the patient. No language interpreter was used.  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     Family History  Problem Relation Age of Onset   Stroke Mother    Heart disease Mother    Heart disease Sister    Breast cancer Neg Hx     Social History   Tobacco Use   Smoking status: Never   Smokeless tobacco: Never  Vaping Use   Vaping Use: Former  Substance Use Topics   Alcohol use: Never   Drug use: Never    Home Medications Prior to Admission medications   Medication Sig Start Date End Date Taking? Authorizing Provider  acetaminophen (TYLENOL) 500 MG tablet Take 650 mg by mouth every 6 (six) hours as needed.    [provider]  amLODipine (NORVASC) 2.5 MG tablet TAKE 1 TABLET BY MOUTH  DAILY 01/27/20   Dutch Quint B, FNP  Ascorbic Acid (VITAMIN C) 100 MG tablet Take 100 mg by mouth daily.    [provider]  aspirin EC 81 MG tablet Take 81 mg by mouth daily.    [provider]  cholecalciferol (VITAMIN D3) 25 MCG (1000 UNIT) tablet Take 1,000 Units by mouth daily.    [provider]  fluticasone (FLONASE) 50 MCG/ACT nasal spray Place 2 sprays into both nostrils daily. 06/09/20   Cirigliano, Garvin Fila, DO  furosemide (LASIX) 20 MG tablet Take 1 tablet (20 mg total) by mouth daily  as needed. 05/04/20   Cirigliano, Garvin Fila, DO  hydrochlorothiazide (HYDRODIURIL) 25 MG tablet Take 1 tablet (25 mg total) by mouth daily. 05/04/20   Cirigliano, Garvin Fila, DO  losartan (COZAAR) 100 MG tablet Take 1 tablet (100 mg total) by mouth daily. 06/09/20   Cirigliano, Garvin Fila, DO  metoprolol tartrate (LOPRESSOR) 100 MG tablet Take 1 tablet (100 mg total) by mouth 2 (two) times daily. 09/09/20   Dutch Quint B, FNP  Multiple Vitamin (MULTIVITAMIN) tablet Take 1 tablet by mouth daily.    [provider]  oxybutynin (DITROPAN) 5 MG tablet Take 1 tablet (5 mg total) by mouth 3 (three) times daily. 10/20/20   Haydee Salter, MD  potassium chloride (KLOR-CON) 10 MEQ tablet Take 1  tablet (10 mEq total) by mouth daily. 10/15/19   Libby Maw, MD  simvastatin (ZOCOR) 40 MG tablet Take 1 tablet (40 mg total) by mouth at bedtime. 09/09/20   Dutch Quint B, FNP  vitamin E 1000 UNIT capsule Take 1,000 Units by mouth daily.    [provider]    Allergies    Nsaids  Review of Systems   Review of Systems  Constitutional:  Negative for chills and fever.  Gastrointestinal:  Negative for nausea and vomiting.  Musculoskeletal:  Positive for arthralgias and myalgias.  Skin:  Negative for color change, pallor, rash and wound.  All other systems reviewed and are negative.  Physical Exam Updated Vital Signs BP (!) 136/101 (BP Location: Left Arm)   Pulse 68   Temp 98.9 F (37.2 C) (Oral)   Resp 18   Ht 5\' 2"  (1.575 m)   Wt 111.1 kg   SpO2 97%   BMI 44.81 kg/m   Physical Exam Vitals and nursing note reviewed.  Constitutional:      General: She is not in acute distress.    Appearance: Normal appearance. She is obese. She is not ill-appearing, toxic-appearing or diaphoretic.  HENT:     Head: Normocephalic and atraumatic.  Eyes:     Extraocular Movements: Extraocular movements intact.  Cardiovascular:     Rate and Rhythm: Normal rate.  Pulmonary:     Effort: Pulmonary effort is normal. No respiratory distress.  Musculoskeletal:        General: Normal range of motion.     Cervical back: Normal range of motion.     Comments: Full range of motion to right wrist and fingers.  No warmth, redness, swelling or deformity noted.  Positive Phalen's test, patient with diminished sensation to thumb and index finger on the right side.  Skin:    General: Skin is warm and dry.  Neurological:     General: No focal deficit present.     Mental Status: She is alert.  Psychiatric:        Mood and Affect: Mood normal.        Behavior: Behavior normal.    ED Results / Procedures / Treatments   Labs (all labs ordered are listed, but only abnormal results are  displayed) Labs Reviewed - No data to display  EKG None  Radiology No results found.  Procedures Procedures   Medications Ordered in ED Medications - No data to display  ED Course  I have reviewed the triage vital signs and the nursing notes.  Pertinent labs & imaging results that were available during my care of the patient were reviewed by me and considered in my medical decision making (see chart for details).  MDM Rules/Calculators/A&P                         Patient presents today with right hand pain.  She was diagnosed with carpal tunnel at orthopedics and nerve conduction studies yesterday.  Patient is only here for pain relief in interim before she can get to her subsequent orthopedics appointment.  No acute injury, no warmth, swelling, or deformity noted.  Low suspicion for septic joint, gout, or other infectious cause.  Patient has already been trying RICE, heat, splinting, Tylenol, and Voltaren cream.  Unfortunately, she is allergic to NSAIDs.  Offered narcotics for additional pain relief which patient declined.  Because she describes the pain as cramping in nature and her main desire is to, will trial muscle relaxers for relief.  Patient is amenable with plan, discharged in stable condition.   This is a shared visit with supervising physician Dr. Roslynn Amble who has independently evaluated patient & provided guidance in evaluation/management/disposition, in agreement with care    Final Clinical Impression(s) / ED Diagnoses Final diagnoses:  Carpal tunnel syndrome of right wrist    Rx / DC Orders ED Discharge Orders          Ordered    methocarbamol (ROBAXIN) 500 MG tablet  2 times daily PRN        11/19/20 1445          An After Visit Summary was printed and given to the patient.    Nestor Lewandowsky 11/19/20 1449    Lucrezia Starch, MD 11/19/20 2115

## 2020-11-19 NOTE — Discharge Instructions (Addendum)
Seen today for evaluation of your carpal tunnel syndrome.  My assessment is in line with orthopedics and your nerve tests, as I feel your symptoms are due to carpal tunnel syndrome.  Continue with rest, ice, compression, elevation, Tylenol, Voltaren cream, and heat as you are able to tolerate.  Because you described your pain is cramping in nature, I have written you a prescription for Robaxin which is a muscle relaxer.  This can also be mildly sedating, so my hope is that it will help you be able to rest in the interim until you are able to see orthopedics again.   Return if development of new or worsening symptoms.

## 2020-11-21 NOTE — Procedures (Signed)
EMG & NCV Findings: Evaluation of the right median motor nerve showed prolonged distal onset latency (6.3 ms), reduced amplitude (1.2 mV), and decreased conduction velocity (Elbow-Wrist, 39 m/s).  The right median (across palm) sensory nerve showed prolonged distal peak latency (Wrist, 9.1 ms), reduced amplitude (6.3 V), and prolonged distal peak latency (Palm, 5.8 ms).  All remaining nerves (as indicated in the following tables) were within normal limits.    Needle evaluation of the right abductor pollicis brevis muscle showed increased insertional activity and slightly increased spontaneous activity.  All remaining muscles (as indicated in the following table) showed no evidence of electrical instability.    Impression: The above electrodiagnostic study is ABNORMAL and reveals evidence of a severe right median nerve entrapment at the wrist (carpal tunnel syndrome) affecting sensory and motor components.   There is no significant electrodiagnostic evidence of any other focal nerve entrapment, brachial plexopathy or cervical radiculopathy  Recommendations: 1.  Follow-up with referring physician. 2.  Continue current management of symptoms. 3.  Continue use of resting splint at night-time and as needed during the day. 4.  Suggest surgical evaluation.  ___________________________ Laurence Spates FAAPMR Board Certified, American Board of Physical Medicine and Rehabilitation    Nerve Conduction Studies Anti Sensory Summary Table   Stim Site NR Peak (ms) Norm Peak (ms) P-T Amp (V) Norm P-T Amp Site1 Site2 Delta-P (ms) Dist (cm) Vel (m/s) Norm Vel (m/s)  Right Median Acr Palm Anti Sensory (2nd Digit)  31.3C  Wrist    *9.1 <3.6 *6.3 >10 Wrist Palm 3.3 0.0    Palm    *5.8 <2.0 7.1         Right Radial Anti Sensory (Base 1st Digit)  32C  Wrist    2.4 <3.1 12.2  Wrist Base 1st Digit 2.4 0.0    Right Ulnar Anti Sensory (5th Digit)  32C  Wrist    3.6 <3.7 16.3 >15.0 Wrist 5th Digit 3.6 14.0 39  >38   Motor Summary Table   Stim Site NR Onset (ms) Norm Onset (ms) O-P Amp (mV) Norm O-P Amp Site1 Site2 Delta-0 (ms) Dist (cm) Vel (m/s) Norm Vel (m/s)  Right Median Motor (Abd Poll Brev)  32.5C  Wrist    *6.3 <4.2 *1.2 >5 Elbow Wrist 5.1 20.0 *39 >50  Elbow    11.4  1.2         Right Ulnar Motor (Abd Dig Min)  32.5C  Wrist    3.1 <4.2 7.8 >3 B Elbow Wrist 3.4 18.0 53 >53  B Elbow    6.5  7.5  A Elbow B Elbow 0.9 10.0 111 >53  A Elbow    7.4  4.5          EMG   Side Muscle Nerve Root Ins Act Fibs Psw Amp Dur Poly Recrt Int Fraser Din Comment  Right Abd Poll Brev Median C8-T1 *Incr *1+ *1+ Nml Nml 0 Nml Nml   Right 1stDorInt Ulnar C8-T1 Nml Nml Nml Nml Nml 0 Nml Nml   Right PronatorTeres Median C6-7 Nml Nml Nml Nml Nml 0 Nml Nml     Nerve Conduction Studies Anti Sensory Left/Right Comparison   Stim Site L Lat (ms) R Lat (ms) L-R Lat (ms) L Amp (V) R Amp (V) L-R Amp (%) Site1 Site2 L Vel (m/s) R Vel (m/s) L-R Vel (m/s)  Median Acr Palm Anti Sensory (2nd Digit)  31.3C  Wrist  *9.1   *6.3  Wrist Palm     Palm  *  5.8   7.1        Radial Anti Sensory (Base 1st Digit)  32C  Wrist  2.4   12.2  Wrist Base 1st Digit     Ulnar Anti Sensory (5th Digit)  32C  Wrist  3.6   16.3  Wrist 5th Digit  39    Motor Left/Right Comparison   Stim Site L Lat (ms) R Lat (ms) L-R Lat (ms) L Amp (mV) R Amp (mV) L-R Amp (%) Site1 Site2 L Vel (m/s) R Vel (m/s) L-R Vel (m/s)  Median Motor (Abd Poll Brev)  32.5C  Wrist  *6.3   *1.2  Elbow Wrist  *39   Elbow  11.4   1.2        Ulnar Motor (Abd Dig Min)  32.5C  Wrist  3.1   7.8  B Elbow Wrist  53   B Elbow  6.5   7.5  A Elbow B Elbow  111   A Elbow  7.4   4.5           Waveforms:

## 2020-11-21 NOTE — Progress Notes (Signed)
Kristin Lambert - 70 y.o. female MRN [id number]  Date of birth: 1950/08/30  Office Visit Note: Visit Date: 11/18/2020 PCP: Haydee Salter, MD Referred by: Haydee Salter, MD  Subjective: Chief Complaint  Patient presents with   Right Hand - Pain, Numbness   HPI:  Kristin Lambert is a 70 y.o. female who comes in today at the request of Dr. Audria Nine for electrodiagnostic study of the Right upper extremities.  Patient is Right hand dominant.  She reports chronic worsening very severe pain and numbness in the first 2 digits on the right hand.  She denies any specific radicular complaints.  She denies any real left-sided complaints.  No prior electrodiagnostic studies to review.  She has failed all manner of conservative care otherwise with the wrist splints and anti-inflammatories.  She has been using Voltaren gel on her whole hand.   ROS Otherwise per HPI.  Assessment & Plan: Visit Diagnoses:    ICD-10-CM   1. Paresthesia of skin  R20.2 NCV with EMG (electromyography)      Plan: Impression: The above electrodiagnostic study is ABNORMAL and reveals evidence of a severe right median nerve entrapment at the wrist (carpal tunnel syndrome) affecting sensory and motor components.   There is no significant electrodiagnostic evidence of any other focal nerve entrapment, brachial plexopathy or cervical radiculopathy  Recommendations: 1.  Follow-up with referring physician. 2.  Continue current management of symptoms. 3.  Continue use of resting splint at night-time and as needed during the day. 4.  Suggest surgical evaluation.  Meds & Orders: No orders of the defined types were placed in this encounter.   Orders Placed This Encounter  Procedures   NCV with EMG (electromyography)    Follow-up: Return in about 2 weeks (around 12/02/2020) for Audria Nine, MD.   Procedures: No procedures performed  EMG & NCV Findings: Evaluation of the right median motor nerve showed prolonged  distal onset latency (6.3 ms), reduced amplitude (1.2 mV), and decreased conduction velocity (Elbow-Wrist, 39 m/s).  The right median (across palm) sensory nerve showed prolonged distal peak latency (Wrist, 9.1 ms), reduced amplitude (6.3 V), and prolonged distal peak latency (Palm, 5.8 ms).  All remaining nerves (as indicated in the following tables) were within normal limits.    Needle evaluation of the right abductor pollicis brevis muscle showed increased insertional activity and slightly increased spontaneous activity.  All remaining muscles (as indicated in the following table) showed no evidence of electrical instability.    Impression: The above electrodiagnostic study is ABNORMAL and reveals evidence of a severe right median nerve entrapment at the wrist (carpal tunnel syndrome) affecting sensory and motor components.   There is no significant electrodiagnostic evidence of any other focal nerve entrapment, brachial plexopathy or cervical radiculopathy  Recommendations: 1.  Follow-up with referring physician. 2.  Continue current management of symptoms. 3.  Continue use of resting splint at night-time and as needed during the day. 4.  Suggest surgical evaluation.  ___________________________ Laurence Spates FAAPMR Board Certified, American Board of Physical Medicine and Rehabilitation    Nerve Conduction Studies Anti Sensory Summary Table   Stim Site NR Peak (ms) Norm Peak (ms) P-T Amp (V) Norm P-T Amp Site1 Site2 Delta-P (ms) Dist (cm) Vel (m/s) Norm Vel (m/s)  Right Median Acr Palm Anti Sensory (2nd Digit)  31.3C  Wrist    *9.1 <3.6 *6.3 >10 Wrist Palm 3.3 0.0    Palm    *5.8 <2.0 7.1  Right Radial Anti Sensory (Base 1st Digit)  32C  Wrist    2.4 <3.1 12.2  Wrist Base 1st Digit 2.4 0.0    Right Ulnar Anti Sensory (5th Digit)  32C  Wrist    3.6 <3.7 16.3 >15.0 Wrist 5th Digit 3.6 14.0 39 >38   Motor Summary Table   Stim Site NR Onset (ms) Norm Onset (ms) O-P Amp  (mV) Norm O-P Amp Site1 Site2 Delta-0 (ms) Dist (cm) Vel (m/s) Norm Vel (m/s)  Right Median Motor (Abd Poll Brev)  32.5C  Wrist    *6.3 <4.2 *1.2 >5 Elbow Wrist 5.1 20.0 *39 >50  Elbow    11.4  1.2         Right Ulnar Motor (Abd Dig Min)  32.5C  Wrist    3.1 <4.2 7.8 >3 B Elbow Wrist 3.4 18.0 53 >53  B Elbow    6.5  7.5  A Elbow B Elbow 0.9 10.0 111 >53  A Elbow    7.4  4.5          EMG   Side Muscle Nerve Root Ins Act Fibs Psw Amp Dur Poly Recrt Int Fraser Din Comment  Right Abd Poll Brev Median C8-T1 *Incr *1+ *1+ Nml Nml 0 Nml Nml   Right 1stDorInt Ulnar C8-T1 Nml Nml Nml Nml Nml 0 Nml Nml   Right PronatorTeres Median C6-7 Nml Nml Nml Nml Nml 0 Nml Nml     Nerve Conduction Studies Anti Sensory Left/Right Comparison   Stim Site L Lat (ms) R Lat (ms) L-R Lat (ms) L Amp (V) R Amp (V) L-R Amp (%) Site1 Site2 L Vel (m/s) R Vel (m/s) L-R Vel (m/s)  Median Acr Palm Anti Sensory (2nd Digit)  31.3C  Wrist  *9.1   *6.3  Wrist Palm     Palm  *5.8   7.1        Radial Anti Sensory (Base 1st Digit)  32C  Wrist  2.4   12.2  Wrist Base 1st Digit     Ulnar Anti Sensory (5th Digit)  32C  Wrist  3.6   16.3  Wrist 5th Digit  39    Motor Left/Right Comparison   Stim Site L Lat (ms) R Lat (ms) L-R Lat (ms) L Amp (mV) R Amp (mV) L-R Amp (%) Site1 Site2 L Vel (m/s) R Vel (m/s) L-R Vel (m/s)  Median Motor (Abd Poll Brev)  32.5C  Wrist  *6.3   *1.2  Elbow Wrist  *39   Elbow  11.4   1.2        Ulnar Motor (Abd Dig Min)  32.5C  Wrist  3.1   7.8  B Elbow Wrist  53   B Elbow  6.5   7.5  A Elbow B Elbow  111   A Elbow  7.4   4.5           Waveforms:            Clinical History: No specialty comments available.     Objective:  VS:  HT:    WT:   BMI:     BP:   HR: bpm  TEMP: ( )  RESP:  Physical Exam Musculoskeletal:        General: No swelling, tenderness or deformity.     Comments: Inspection reveals no atrophy of the bilateral APB or FDI or hand intrinsics. There is no swelling,  color changes, allodynia or dystrophic changes. There is 5 out of 5  strength in the bilateral wrist extension, finger abduction and long finger flexion. There is intact sensation to light touch in all dermatomal and peripheral nerve distributions. There is a negative Froment's test bilaterally. There is a negative Tinel's test at the bilateral wrist and elbow. There is a negative Phalen's test bilaterally. There is a negative Hoffmann's test bilaterally.  Skin:    General: Skin is warm and dry.     Findings: No erythema or rash.  Neurological:     General: No focal deficit present.     Mental Status: She is alert and oriented to person, place, and time.     Motor: No weakness or abnormal muscle tone.     Coordination: Coordination normal.  Psychiatric:        Mood and Affect: Mood normal.        Behavior: Behavior normal.     Imaging: No results found.

## 2020-12-02 ENCOUNTER — Other Ambulatory Visit: Payer: Self-pay

## 2020-12-02 ENCOUNTER — Ambulatory Visit: Payer: Medicare Other | Admitting: Orthopedic Surgery

## 2020-12-02 ENCOUNTER — Other Ambulatory Visit: Payer: Self-pay | Admitting: Orthopedic Surgery

## 2020-12-02 ENCOUNTER — Telehealth: Payer: Self-pay | Admitting: Orthopedic Surgery

## 2020-12-02 ENCOUNTER — Encounter: Payer: Self-pay | Admitting: Orthopedic Surgery

## 2020-12-02 DIAGNOSIS — G5601 Carpal tunnel syndrome, right upper limb: Secondary | ICD-10-CM | POA: Diagnosis not present

## 2020-12-02 MED ORDER — METHOCARBAMOL 500 MG PO TABS: 500.0000 mg | ORAL_TABLET | Freq: Two times a day (BID) | ORAL | 0 refills | Status: DC | PRN

## 2020-12-02 NOTE — H&P (View-Only) (Signed)
Office Visit Note   Patient: Kristin Lambert           Date of Birth: 05/15/1950           MRN: [id number] Visit Date: 12/02/2020              Requested by: Haydee Salter, MD [place],  [place] [id number] PCP: Haydee Salter, MD   Assessment & Plan: Visit Diagnoses:  1. Carpal tunnel syndrome of right wrist     Plan: We again discussed the nature of and treatment options for carpal tunnel syndrome.  Recent electrodiagnostic studies demonstrate severe median nerve compression at the wrist.  We again discussed carpal tunnel surgery including the risks, benefits, alternatives, and expected postop course.  She wants to proceed with surgery.  She also describes severe hand cramping that has improved with a recent prescription for methocarbamol.  I can give her a refill of this medication today.   Follow-Up Instructions: No follow-ups on file.   Orders:  No orders of the defined types were placed in this encounter.  No orders of the defined types were placed in this encounter.     Procedures: No procedures performed   Clinical Data: No additional findings.   Subjective: Chief Complaint  Patient presents with   Right Hand - Follow-up    Had EMG/NCS done, here to review it, went to [facility] ED on 11/19/20 for the pain and not being able to sleep with it.     This is a 70 yo RHD F who presents with continued numbness and tingling of the thumb, index, and middle fingers of the right hand.  This has been going on for approximately two months now.  Recent EMG/NCS demonstrated severe CTS.  She has been wearing a brace at night and part of the day which hasn't helped.  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  She also describes cramping of her hands at night that is keeping her awake.  She was seen in the ER and given a prescription for methocarbamol which she  reports has been very helpful.    Review of Systems   Objective: Vital Signs: There were no vitals taken for this visit.  Physical Exam Constitutional:      Appearance: Normal appearance.  Cardiovascular:     Rate and Rhythm: Normal rate.     Pulses: Normal pulses.  Pulmonary:     Effort: Pulmonary effort is normal.  Skin:    General: Skin is warm and dry.     Capillary Refill: Capillary refill takes less than 2 seconds.  Neurological:     Mental Status: She is alert.    Right Hand Exam   Tenderness  The patient is experiencing no tenderness.   Other  Sensation: decreased Pulse: present  Comments:  + Carpal tunnel compression and Phalen tests.  Equivocal Tinel test.  4/5 thenar motor strength compared to the contralateral side.      Specialty Comments:  No specialty comments available.  Imaging: No results found.   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 10/18/2020   Multiple thyroid nodules  10/18/2020   Morbid obesity with BMI of 45.0-49.9, adult ([place]) 11/13/2019   Vitamin D deficiency 07/30/2019   Hyperlipidemia 07/30/2019   Osteoarthritis of knees, bilateral 06/19/2019   Essential hypertension 06/19/2019   Past Medical History:  Diagnosis Date   Arthritis    Carpal tunnel syndrome    Hypertension     Family History  Problem Relation Age of Onset   Stroke Mother    Heart disease Mother    Heart disease Sister    Breast cancer Neg Hx     Past Surgical History:  Procedure Laterality Date   ABDOMINAL HYSTERECTOMY     BIOPSY THYROID  05/2019   CESAREAN SECTION     CHOLECYSTECTOMY     KNEE REPAIR EXTENSOR MECHANISM Right    Social History   Occupational History   Occupation:  Retired- Nursing    Comment: [place], [place]  Tobacco Use   Smoking status: Never   Smokeless tobacco: Never  Vaping Use   Vaping Use: Former  Substance and Sexual Activity   Alcohol use: Never   Drug use: Never   Sexual activity: Not Currently

## 2020-12-02 NOTE — Progress Notes (Signed)
Office Visit Note   Patient: Kristin Lambert           Date of Birth: [birth year]           MRN: [id number] Visit Date: 12/02/2020              Requested by: Haydee Salter, MD [place],  [place] [id number] PCP: Haydee Salter, MD   Assessment & Plan: Visit Diagnoses:  1. Carpal tunnel syndrome of right wrist     Plan: We again discussed the nature of and treatment options for carpal tunnel syndrome.  Recent electrodiagnostic studies demonstrate severe median nerve compression at the wrist.  We again discussed carpal tunnel surgery including the risks, benefits, alternatives, and expected postop course.  She wants to proceed with surgery.  She also describes severe hand cramping that has improved with a recent prescription for methocarbamol.  I can give her a refill of this medication today.   Follow-Up Instructions: No follow-ups on file.   Orders:  No orders of the defined types were placed in this encounter.  No orders of the defined types were placed in this encounter.     Procedures: No procedures performed   Clinical Data: No additional findings.   Subjective: Chief Complaint  Patient presents with   Right Hand - Follow-up    Had EMG/NCS done, here to review it, went to [facility] ED on 11/19/20 for the pain and not being able to sleep with it.     This is a 70 yo RHD F who presents with continued numbness and tingling of the thumb, index, and middle fingers of the right hand.  This has been going on for approximately two months now.  Recent EMG/NCS demonstrated severe CTS.  She has been wearing a brace at night and part of the day which hasn't helped.  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  She also describes cramping of her hands at night that is keeping her awake.  She was seen in the ER and given a prescription for methocarbamol which she  reports has been very helpful.    Review of Systems   Objective: Vital Signs: There were no vitals taken for this visit.  Physical Exam Constitutional:      Appearance: Normal appearance.  Cardiovascular:     Rate and Rhythm: Normal rate.     Pulses: Normal pulses.  Pulmonary:     Effort: Pulmonary effort is normal.  Skin:    General: Skin is warm and dry.     Capillary Refill: Capillary refill takes less than 2 seconds.  Neurological:     Mental Status: She is alert.    Right Hand Exam   Tenderness  The patient is experiencing no tenderness.   Other  Sensation: decreased Pulse: present  Comments:  + Carpal tunnel compression and Phalen tests.  Equivocal Tinel test.  4/5 thenar motor strength compared to the contralateral side.      Specialty Comments:  No specialty comments available.  Imaging: No results found.   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 10/18/2020   Multiple thyroid nodules  10/18/2020   Morbid obesity with BMI of 45.0-49.9, adult ([place]) 11/13/2019   Vitamin D deficiency 07/30/2019   Hyperlipidemia 07/30/2019   Osteoarthritis of knees, bilateral 06/19/2019   Essential hypertension 06/19/2019   Past Medical History:  Diagnosis Date   Arthritis    Carpal tunnel syndrome    Hypertension     Family History  Problem Relation Age of Onset   Stroke Mother    Heart disease Mother    Heart disease Sister    Breast cancer Neg Hx     Past Surgical History:  Procedure Laterality Date   ABDOMINAL HYSTERECTOMY     BIOPSY THYROID  05/2019   CESAREAN SECTION     CHOLECYSTECTOMY     KNEE REPAIR EXTENSOR MECHANISM Right    Social History   Occupational History   Occupation:  Retired- Nursing    Comment: [place], [place]  Tobacco Use   Smoking status: Never   Smokeless tobacco: Never  Vaping Use   Vaping Use: Former  Substance and Sexual Activity   Alcohol use: Never   Drug use: Never   Sexual activity: Not Currently

## 2020-12-02 NOTE — Telephone Encounter (Signed)
Patient called. She says she did not get the Robaxin called in. Would like that called in to her pharmacy.

## 2020-12-14 ENCOUNTER — Other Ambulatory Visit: Payer: Self-pay | Admitting: Orthopedic Surgery

## 2020-12-14 MED ORDER — METHOCARBAMOL 500 MG PO TABS: 500.0000 mg | ORAL_TABLET | Freq: Two times a day (BID) | ORAL | 0 refills | Status: DC | PRN

## 2020-12-14 NOTE — Telephone Encounter (Signed)
Patient called needing Rx refilled Robaxin   The number to contact patient is [phone]

## 2020-12-15 ENCOUNTER — Other Ambulatory Visit: Payer: Self-pay

## 2020-12-15 ENCOUNTER — Encounter (HOSPITAL_BASED_OUTPATIENT_CLINIC_OR_DEPARTMENT_OTHER): Payer: Self-pay | Admitting: *Deleted

## 2020-12-16 ENCOUNTER — Encounter (HOSPITAL_BASED_OUTPATIENT_CLINIC_OR_DEPARTMENT_OTHER)
Admission: RE | Admit: 2020-12-16 | Discharge: 2020-12-16 | Disposition: A | Discharge status: Home / Self Care | Payer: Medicare Other | Source: Ambulatory Visit | Attending: Orthopedic Surgery | Admitting: Orthopedic Surgery

## 2020-12-16 DIAGNOSIS — Z01818 Encounter for other preprocedural examination: Secondary | ICD-10-CM | POA: Insufficient documentation

## 2020-12-16 DIAGNOSIS — I10 Essential (primary) hypertension: Secondary | ICD-10-CM | POA: Insufficient documentation

## 2020-12-16 LAB — BASIC METABOLIC PANEL
Anion gap: 10 (ref 5–15)
BUN: 19 mg/dL (ref 8–23)
CO2: 23 mmol/L (ref 22–32)
Calcium: 9.4 mg/dL (ref 8.9–10.3)
Chloride: 107 mmol/L (ref 98–111)
Creatinine, Ser: 0.85 mg/dL (ref 0.44–1.00)
GFR, Estimated: >60 mL/min (ref >60)
Glucose, Bld: 133 mg/dL — ABNORMAL HIGH (ref 70–99)
Potassium: 3.9 mmol/L (ref 3.5–5.1)
Sodium: 140 mmol/L (ref 135–145)

## 2020-12-16 NOTE — Progress Notes (Signed)
EKG reviewed by Dr. Gloris Manchester, pt denies any discomfort, will reevaluate day of surgery.

## 2020-12-21 ENCOUNTER — Ambulatory Visit (HOSPITAL_BASED_OUTPATIENT_CLINIC_OR_DEPARTMENT_OTHER)
Admission: RE | Admit: 2020-12-21 | Discharge: 2020-12-21 | Disposition: A | Discharge status: Home / Self Care | Payer: Medicare Other | Attending: Orthopedic Surgery | Admitting: Orthopedic Surgery

## 2020-12-21 ENCOUNTER — Encounter (HOSPITAL_BASED_OUTPATIENT_CLINIC_OR_DEPARTMENT_OTHER): Payer: Self-pay | Admitting: Orthopedic Surgery

## 2020-12-21 ENCOUNTER — Encounter (HOSPITAL_BASED_OUTPATIENT_CLINIC_OR_DEPARTMENT_OTHER)
Admission: RE | Disposition: A | Discharge status: Home / Self Care | Payer: Self-pay | Source: Home / Self Care | Attending: Orthopedic Surgery

## 2020-12-21 ENCOUNTER — Other Ambulatory Visit: Payer: Self-pay

## 2020-12-21 ENCOUNTER — Ambulatory Visit (HOSPITAL_BASED_OUTPATIENT_CLINIC_OR_DEPARTMENT_OTHER): Payer: Medicare Other | Admitting: Anesthesiology

## 2020-12-21 DIAGNOSIS — Z6841 Body Mass Index (BMI) 40.0 and over, adult: Secondary | ICD-10-CM | POA: Insufficient documentation

## 2020-12-21 DIAGNOSIS — I739 Peripheral vascular disease, unspecified: Secondary | ICD-10-CM | POA: Insufficient documentation

## 2020-12-21 DIAGNOSIS — G5601 Carpal tunnel syndrome, right upper limb: Secondary | ICD-10-CM | POA: Diagnosis not present

## 2020-12-21 DIAGNOSIS — M170 Bilateral primary osteoarthritis of knee: Secondary | ICD-10-CM | POA: Insufficient documentation

## 2020-12-21 DIAGNOSIS — I4891 Unspecified atrial fibrillation: Secondary | ICD-10-CM | POA: Diagnosis not present

## 2020-12-21 DIAGNOSIS — I10 Essential (primary) hypertension: Secondary | ICD-10-CM | POA: Diagnosis not present

## 2020-12-21 DIAGNOSIS — J309 Allergic rhinitis, unspecified: Secondary | ICD-10-CM | POA: Diagnosis not present

## 2020-12-21 DIAGNOSIS — G5602 Carpal tunnel syndrome, left upper limb: Secondary | ICD-10-CM

## 2020-12-21 DIAGNOSIS — E785 Hyperlipidemia, unspecified: Secondary | ICD-10-CM | POA: Diagnosis not present

## 2020-12-21 HISTORY — DX: Other pulmonary embolism without acute cor pulmonale: I26.99

## 2020-12-21 HISTORY — PX: CARPAL TUNNEL RELEASE: SHX101

## 2020-12-21 SURGERY — CARPAL TUNNEL RELEASE
Anesthesia: Monitor Anesthesia Care | Laterality: Right

## 2020-12-21 MED ORDER — ACETAMINOPHEN 500 MG PO TABS
1000.0000 mg | ORAL_TABLET | Freq: Once | ORAL | Status: AC
  Administered 2020-12-21: 1000 mg via ORAL

## 2020-12-21 MED ORDER — MIDAZOLAM HCL 2 MG/2ML IJ SOLN
INTRAMUSCULAR | Status: AC
  Filled 2020-12-21: qty 2

## 2020-12-21 MED ORDER — ONDANSETRON HCL 4 MG/2ML IJ SOLN
INTRAMUSCULAR | Status: AC
  Filled 2020-12-21: qty 2

## 2020-12-21 MED ORDER — ACETAMINOPHEN 500 MG PO TABS
ORAL_TABLET | ORAL | Status: AC
  Filled 2020-12-21: qty 2

## 2020-12-21 MED ORDER — ATROPINE SULFATE 0.4 MG/ML IV SOLN
INTRAVENOUS | Status: AC
  Filled 2020-12-21: qty 1

## 2020-12-21 MED ORDER — ONDANSETRON HCL 4 MG/2ML IJ SOLN
INTRAMUSCULAR | Status: DC | PRN
  Administered 2020-12-21: 4 mg via INTRAVENOUS

## 2020-12-21 MED ORDER — LACTATED RINGERS IV SOLN: INTRAVENOUS | Status: DC

## 2020-12-21 MED ORDER — SUCCINYLCHOLINE CHLORIDE 200 MG/10ML IV SOSY
PREFILLED_SYRINGE | INTRAVENOUS | Status: AC
  Filled 2020-12-21: qty 10

## 2020-12-21 MED ORDER — PROPOFOL 500 MG/50ML IV EMUL
INTRAVENOUS | Status: DC | PRN
  Administered 2020-12-21: 75 ug/kg/min via INTRAVENOUS

## 2020-12-21 MED ORDER — FENTANYL CITRATE (PF) 100 MCG/2ML IJ SOLN
INTRAMUSCULAR | Status: AC
  Filled 2020-12-21: qty 2

## 2020-12-21 MED ORDER — PROPOFOL 500 MG/50ML IV EMUL
INTRAVENOUS | Status: AC
  Filled 2020-12-21: qty 50

## 2020-12-21 MED ORDER — LIDOCAINE HCL (PF) 1 % IJ SOLN
INTRAMUSCULAR | Status: AC
  Filled 2020-12-21: qty 30

## 2020-12-21 MED ORDER — OXYCODONE HCL 5 MG PO TABS: 5.0000 mg | ORAL_TABLET | Freq: Four times a day (QID) | ORAL | 0 refills | Status: AC | PRN

## 2020-12-21 MED ORDER — OXYCODONE HCL 5 MG PO TABS: 5.0000 mg | ORAL_TABLET | Freq: Once | ORAL | Status: DC | PRN

## 2020-12-21 MED ORDER — ONDANSETRON HCL 4 MG/2ML IJ SOLN: 4.0000 mg | Freq: Once | INTRAMUSCULAR | Status: DC | PRN

## 2020-12-21 MED ORDER — EPHEDRINE 5 MG/ML INJ
INTRAVENOUS | Status: AC
  Filled 2020-12-21: qty 5

## 2020-12-21 MED ORDER — BUPIVACAINE HCL (PF) 0.25 % IJ SOLN
INTRAMUSCULAR | Status: AC
  Filled 2020-12-21: qty 30

## 2020-12-21 MED ORDER — LIDOCAINE 2% (20 MG/ML) 5 ML SYRINGE
INTRAMUSCULAR | Status: AC
  Filled 2020-12-21: qty 5

## 2020-12-21 MED ORDER — LIDOCAINE 2% (20 MG/ML) 5 ML SYRINGE
INTRAMUSCULAR | Status: DC | PRN
  Administered 2020-12-21: 40 mg via INTRAVENOUS

## 2020-12-21 MED ORDER — PROPOFOL 10 MG/ML IV BOLUS
INTRAVENOUS | Status: DC | PRN
  Administered 2020-12-21: 40 mg via INTRAVENOUS

## 2020-12-21 MED ORDER — LIDOCAINE HCL (PF) 1 % IJ SOLN
INTRAMUSCULAR | Status: DC | PRN
  Administered 2020-12-21: 10 mL

## 2020-12-21 MED ORDER — OXYCODONE HCL 5 MG/5ML PO SOLN: 5.0000 mg | Freq: Once | ORAL | Status: DC | PRN

## 2020-12-21 MED ORDER — CEFAZOLIN SODIUM-DEXTROSE 2-4 GM/100ML-% IV SOLN
INTRAVENOUS | Status: AC
  Filled 2020-12-21: qty 100

## 2020-12-21 MED ORDER — 0.9 % SODIUM CHLORIDE (POUR BTL) OPTIME
TOPICAL | Status: DC | PRN
  Administered 2020-12-21: 100 mL

## 2020-12-21 MED ORDER — OXYMETAZOLINE HCL 0.05 % NA SOLN: 1.0000 | Freq: Two times a day (BID) | NASAL | Status: DC

## 2020-12-21 MED ORDER — FENTANYL CITRATE (PF) 100 MCG/2ML IJ SOLN
25.0000 ug | INTRAMUSCULAR | Status: DC | PRN
  Administered 2020-12-21: 50 ug via INTRAVENOUS

## 2020-12-21 MED ORDER — PHENYLEPHRINE 40 MCG/ML (10ML) SYRINGE FOR IV PUSH (FOR BLOOD PRESSURE SUPPORT)
PREFILLED_SYRINGE | INTRAVENOUS | Status: AC
  Filled 2020-12-21: qty 10

## 2020-12-21 SURGICAL SUPPLY — 41 items
APL PRP STRL LF DISP 70% ISPRP (MISCELLANEOUS) ×1
BLADE SURG 15 STRL LF DISP TIS (BLADE) ×1 IMPLANT
BLADE SURG 15 STRL SS (BLADE) ×2
BNDG CMPR 9X4 STRL LF SNTH (GAUZE/BANDAGES/DRESSINGS) ×1
BNDG ELASTIC 3X5.8 VLCR STR LF (GAUZE/BANDAGES/DRESSINGS) ×2 IMPLANT
BNDG ESMARK 4X9 LF (GAUZE/BANDAGES/DRESSINGS) ×2 IMPLANT
BNDG GAUZE ELAST 4 BULKY (GAUZE/BANDAGES/DRESSINGS) ×2 IMPLANT
BNDG PLASTER X FAST 3X3 WHT LF (CAST SUPPLIES) IMPLANT
BNDG PLSTR 9X3 FST ST WHT (CAST SUPPLIES)
CHLORAPREP W/TINT 26 (MISCELLANEOUS) ×2 IMPLANT
CORD BIPOLAR FORCEPS 12FT (ELECTRODE) ×2 IMPLANT
COVER BACK TABLE 60X90IN (DRAPES) ×2 IMPLANT
COVER MAYO STAND STRL (DRAPES) ×2 IMPLANT
CUFF TOURN SGL QUICK 18X4 (TOURNIQUET CUFF) IMPLANT
CUFF TOURN SGL QUICK 24 (TOURNIQUET CUFF)
CUFF TRNQT CYL 24X4X16.5-23 (TOURNIQUET CUFF) IMPLANT
DRAPE EXTREMITY T 121X128X90 (DISPOSABLE) ×2 IMPLANT
DRAPE SURG 17X23 STRL (DRAPES) ×2 IMPLANT
GAUZE SPONGE 4X4 12PLY STRL (GAUZE/BANDAGES/DRESSINGS) ×2 IMPLANT
GAUZE XEROFORM 1X8 LF (GAUZE/BANDAGES/DRESSINGS) IMPLANT
GLOVE SURG ENC MOIS LTX SZ7 (GLOVE) ×2 IMPLANT
GLOVE SURG UNDER POLY LF SZ7 (GLOVE) ×2 IMPLANT
GOWN STRL REUS W/ TWL LRG LVL3 (GOWN DISPOSABLE) ×1 IMPLANT
GOWN STRL REUS W/TWL LRG LVL3 (GOWN DISPOSABLE) ×2
GOWN STRL REUS W/TWL XL LVL3 (GOWN DISPOSABLE) ×2 IMPLANT
NEEDLE HYPO 25X1 1.5 SAFETY (NEEDLE) IMPLANT
NS IRRIG 1000ML POUR BTL (IV SOLUTION) ×2 IMPLANT
PACK BASIN DAY SURGERY FS (CUSTOM PROCEDURE TRAY) ×2 IMPLANT
PAD CAST 3X4 CTTN HI CHSV (CAST SUPPLIES) ×1 IMPLANT
PADDING CAST COTTON 3X4 STRL (CAST SUPPLIES) ×2
SLEEVE SCD COMPRESS KNEE MED (STOCKING) IMPLANT
SUCTION FRAZIER HANDLE 10FR (MISCELLANEOUS)
SUCTION TUBE FRAZIER 10FR DISP (MISCELLANEOUS) IMPLANT
SUT ETHILON 4 0 PS 2 18 (SUTURE) ×2 IMPLANT
SUT MNCRL AB 3-0 PS2 18 (SUTURE) IMPLANT
SUT VICRYL 4-0 PS2 18IN ABS (SUTURE) IMPLANT
SYR BULB EAR ULCER 3OZ GRN STR (SYRINGE) ×2 IMPLANT
SYR CONTROL 10ML LL (SYRINGE) IMPLANT
TOWEL GREEN STERILE FF (TOWEL DISPOSABLE) ×4 IMPLANT
TUBE CONNECTING 20X1/4 (TUBING) IMPLANT
UNDERPAD 30X36 HEAVY ABSORB (UNDERPADS AND DIAPERS) ×2 IMPLANT

## 2020-12-21 NOTE — Op Note (Signed)
   Date of Surgery: 12/21/2020  INDICATIONS: Ms. Kristin Lambert is a 70 y.o.-year-old female with right carpal tunnel syndrome that has failed conservative management.  Risks, benefits, and alternatives to surgery were again discussed with the patient wishing to proceed with surgery.  Informed consent was signed after our discussion.   PREOPERATIVE DIAGNOSIS: 1. Right carpal tunnel syndrome  POSTOPERATIVE DIAGNOSIS: Same.  PROCEDURE: 1. Right carpal tunnel release   SURGEON: Audria Nine, M.D.  ASSIST:   ANESTHESIA:  Local, MAC  IV FLUIDS AND URINE: See anesthesia.  ESTIMATED BLOOD LOSS: <5 mL.  IMPLANTS: * No implants in log *   DRAINS: None  COMPLICATIONS: see description of procedure.  DESCRIPTION OF PROCEDURE: The patient was met in the preoperative holding area where the surgical site was marked and the consent form was verified.  The patient was then taken to the operating room and transferred to the operating table.  All bony prominences were well padded.  A tourniquet was applied to the right forearm.  The operative extremity was prepped and draped in the usual and sterile fashion.  A formal time-out was performed to confirm that this was the correct patient, surgery, side, and site.   Following timeout, the limb was exsanguinated and the tourniquet inflated to 250 mmHg.  A longitudinal incision was made in line with the radial border of the ring finger from distal to the wrist flexion crease to the intersection of Kaplan's cardinal line.  The skin and subcutaneous tissue was sharply divided.  The longitudinally running palmar fascia was incised.  The thenar musculature was bluntly swept off of the transverse carpal ligament.  The ligament was divided from proximal to distal until the fat surrounding the palmar arch was encountered.  A retractor was then placed in the proximal aspect of the wound to visualize the distal antebrachial fascia.  The fascia was sharply divided under direct  visualization.   The wound was then thoroughly irrigated with sterile saline.  The tourniquet was deflated.  Hemostasis was achieved with direct pressure and bipolar electrocautery.  The wound was then closed with 4-0 nylon sutures in a horizontal mattress fashion. The wound was then dressed with xeroform, folded kerlix, and an ace wrap.  The patient was then reversed from anesthesia and transferred to the postoperative bed.  All counts were correct x 2 at the end of the procedure.  The patient was taken to the recovery unit in stable condition.     POSTOPERATIVE PLAN: Patient will be discharged to home with appropriate pain medication and discharge instructions.  She will see me back in the office in 10-14 days for her first postop visit.   Audria Nine, MD 9:07 AM

## 2020-12-21 NOTE — Discharge Instructions (Addendum)
Kristin Lambert, M.D. Hand Surgery  POST-OPERATIVE DISCHARGE INSTRUCTIONS   PRESCRIPTIONS: You have been given a prescription to be taken as directed for post-operative pain control.  You may also take over the counter ibuprofen/aleve and tylenol for pain. Take this as directed on the packaging. Do not exceed 3000 mg tylenol/acetaminophen in 24 hours.  Ibuprofen 600-800 mg (3-4) tablets by mouth every 6 hours as needed for pain.  OR Aleve 2 tablets by mouth every 12 hours (twice daily) as needed for pain.  AND/OR Tylenol 1000 mg (2 tablets) every 8 hours as needed for pain.  Please use your pain medication carefully, as refills are limited and you may not be provided with one.  As stated above, please use over the counter pain medicine - it will also be helpful with decreasing your swelling.    ANESTHESIA: After your surgery, post-surgical discomfort or pain is likely. This discomfort can last several days to a few weeks. At certain times of the day your discomfort may be more intense.   Did you receive a nerve block?  A nerve block can provide pain relief for one hour to two days after your surgery. As long as the nerve block is working, you will experience little or no sensation in the area the surgeon operated on.  As the nerve block wears off, you will begin to experience pain or discomfort. It is very important that you begin taking your prescribed pain medication before the nerve block fully wears off. Treating your pain at the first sign of the block wearing off will ensure your pain is better controlled and more tolerable when full-sensation returns. Do not wait until the pain is intolerable, as the medicine will be less effective. It is better to treat pain in advance than to try and catch up.   General Anesthesia:  If you did not receive a nerve block during your surgery, you will need to start taking your pain medication shortly after your surgery and should continue to do so  as prescribed by your surgeon.     ICE AND ELEVATION: You may use ice for the first 48-72 hours, but it is not critical.   Motion of your fingers is very important s to decrease the swelling.  Elevation, as much as possible for the next 48 hours, is critical for decreasing swelling as well as for pain relief. Elevation means when you are seated or lying down, you hand should be at or above your heart. When walking, the hand needs to be at or above the level of your elbow.  If the bandage gets too tight, it may need to be loosened. Please contact our office and we will instruct you in how to do this.    SURGICAL BANDAGES:  Keep your dressing and/or splint clean and dry at all times.  You can remove your dressing 4 days from now and change with a dry dressing or Band-Aids as needed thereafter. You may place a plastic bag over your bandage to shower, but be careful, do not get your bandages wet.  After the bandages have been removed, it is OK to get the stitches wet in a shower or with hand washing. Do Not soak or submerge the wound yet. Please do not use lotions or creams on the stitches.      HAND THERAPY:  You may not need any. If you do, we will begin this at your follow up visit in the clinic.    ACTIVITY AND  WORK: You are encouraged to move any fingers which are not in the bandage.  Light use of the fingers is allowed to assist the other hand with daily hygiene and eating, but strong gripping or lifting is often uncomfortable and should be avoided.  You might miss a variable period of time from work and hopefully this issue has been discussed prior to surgery. You may not do any heavy work with your affected hand for about 2 weeks.    [facility] [address] [place],  [place]  [address] [phone]     Post Anesthesia Home Care Instructions  Activity: Get plenty of rest for the remainder of the day. A responsible individual must stay with you for 24  hours following the procedure.  For the next 24 hours, DO NOT: -Drive a car -Paediatric nurse -Drink alcoholic beverages -Take any medication unless instructed by your physician -Make any legal decisions or sign important papers.  Meals: Start with liquid foods such as gelatin or soup. Progress to regular foods as tolerated. Avoid greasy, spicy, heavy foods. If nausea and/or vomiting occur, drink only clear liquids until the nausea and/or vomiting subsides. Call your physician if vomiting continues.  Special Instructions/Symptoms: Your throat may feel dry or sore from the anesthesia or the breathing tube placed in your throat during surgery. If this causes discomfort, gargle with warm salt water. The discomfort should disappear within 24 hours.  If you had a scopolamine patch placed behind your ear for the management of post- operative nausea and/or vomiting:  1. The medication in the patch is effective for 72 hours, after which it should be removed.  Wrap patch in a tissue and discard in the trash. Wash hands thoroughly with soap and water. 2. You may remove the patch earlier than 72 hours if you experience unpleasant side effects which may include dry mouth, dizziness or visual disturbances. 3. Avoid touching the patch. Wash your hands with soap and water after contact with the patch.

## 2020-12-21 NOTE — Interval H&P Note (Signed)
History and Physical Interval Note:  12/21/2020 8:30 AM  Kristin Lambert  has presented today for surgery, with the diagnosis of Right Carpal Tunnel Syndrome.  The various methods of treatment have been discussed with the patient and family. After consideration of risks, benefits and other options for treatment, the patient has consented to  Procedure(s): RIGHT CARPAL TUNNEL RELEASE (Right) as a surgical intervention.  The patient's history has been reviewed, patient examined, no change in status, stable for surgery.  I have reviewed the patient's chart and labs.  Questions were answered to the patient's satisfaction.     Kimble Hitchens Najwa Spillane

## 2020-12-21 NOTE — Transfer of Care (Signed)
Immediate Anesthesia Transfer of Care Note  Patient: Kristin Lambert  Procedure(s) Performed: RIGHT CARPAL TUNNEL RELEASE (Right)  Patient Location: PACU  Anesthesia Type:MAC  Level of Consciousness: awake, alert  and oriented  Airway & Oxygen Therapy: Patient Spontanous Breathing and Patient connected to face mask oxygen  Post-op Assessment: Report given to RN and Post -op Vital signs reviewed and stable  Post vital signs: Reviewed and stable  Last Vitals:  Vitals Value Taken Time  BP    Temp    Pulse 59 12/21/20 0914  Resp 21 12/21/20 0914  SpO2 97 % 12/21/20 0914  Vitals shown include unvalidated device data.  Last Pain:  Vitals:   12/21/20 0743  TempSrc: Oral  PainSc: 6       Patients Stated Pain Goal: 5 (68/34/19 6222)  Complications: No notable events documented.

## 2020-12-21 NOTE — Brief Op Note (Signed)
12/21/2020  9:06 AM  PATIENT:  Kristin Lambert  70 y.o. female  PRE-OPERATIVE DIAGNOSIS:  right carpal tunnel syndrome  POST-OPERATIVE DIAGNOSIS:  Right Carpal Tunnel Syndrome  PROCEDURE:  Procedure(s): RIGHT CARPAL TUNNEL RELEASE (Right)  SURGEON:  Surgeon(s) and Role:    * Sherilyn Cooter, MD - Primary  PHYSICIAN ASSISTANT:   ASSISTANTS: none   ANESTHESIA:   local and MAC  EBL:  <5 cc   BLOOD ADMINISTERED:none  DRAINS: none   LOCAL MEDICATIONS USED:  LIDOCAINE   SPECIMEN:  No Specimen  DISPOSITION OF SPECIMEN:  N/A  COUNTS:  YES  TOURNIQUET:   Total Tourniquet Time Documented: Forearm (laterality) - 12 minutes Total: Forearm (laterality) - 12 minutes   DICTATION: .Dragon Dictation  PLAN OF CARE: Discharge to home after PACU  PATIENT DISPOSITION:  PACU - hemodynamically stable.   Delay start of Pharmacological VTE agent (>24hrs) due to surgical blood loss or risk of bleeding: not applicable

## 2020-12-21 NOTE — Anesthesia Preprocedure Evaluation (Addendum)
Anesthesia Evaluation  Patient identified by MRN, date of birth, ID band Patient awake    Reviewed: Allergy & Precautions, NPO status , Patient's Chart, lab work & pertinent test results, reviewed documented beta blocker date and time   History of Anesthesia Complications Negative for: history of anesthetic complications  Airway Mallampati: II  TM Distance: >3 FB Neck ROM: Full    Dental  (+) Dental Advisory Given, Chipped   Pulmonary PE   Pulmonary exam normal        Cardiovascular hypertension, Pt. on medications and Pt. on home beta blockers + Peripheral Vascular Disease  Normal cardiovascular exam+ dysrhythmias Atrial Fibrillation    Complete right ICA occlusion per patient (diagnosed 30 yr ago), told no surgery offered due to adequate collateral circulation and lack of symptoms    Neuro/Psych  Neuromuscular disease (CTS) negative psych ROS   GI/Hepatic negative GI ROS, Neg liver ROS,   Endo/Other  Morbid obesity  Renal/GU negative Renal ROS  Female GU complaint     Musculoskeletal  (+) Arthritis ,   Abdominal (+) + obese,   Peds  Hematology negative hematology ROS (+)   Anesthesia Other Findings   Reproductive/Obstetrics                            Anesthesia Physical Anesthesia Plan  ASA: 3  Anesthesia Plan: MAC   Post-op Pain Management: Tylenol PO (pre-op)   Induction:   PONV Risk Score and Plan: 2 and Propofol infusion and Treatment may vary due to age or medical condition  Airway Management Planned: Natural Airway and Simple Face Mask  Additional Equipment: None  Intra-op Plan:   Post-operative Plan:   Informed Consent: I have reviewed the patients History and Physical, chart, labs and discussed the procedure including the risks, benefits and alternatives for the proposed anesthesia with the patient or authorized representative who has indicated his/her  understanding and acceptance.       Plan Discussed with: CRNA and Anesthesiologist  Anesthesia Plan Comments:         Anesthesia Quick Evaluation

## 2020-12-22 ENCOUNTER — Other Ambulatory Visit: Payer: Self-pay | Admitting: Family

## 2020-12-22 DIAGNOSIS — I10 Essential (primary) hypertension: Secondary | ICD-10-CM

## 2020-12-23 ENCOUNTER — Telehealth: Payer: Self-pay | Admitting: Family Medicine

## 2020-12-23 ENCOUNTER — Encounter (HOSPITAL_BASED_OUTPATIENT_CLINIC_OR_DEPARTMENT_OTHER): Payer: Self-pay | Admitting: Orthopedic Surgery

## 2020-12-23 DIAGNOSIS — J302 Other seasonal allergic rhinitis: Secondary | ICD-10-CM

## 2020-12-23 MED ORDER — FLUTICASONE PROPIONATE 50 MCG/ACT NA SUSP: 2.0000 | Freq: Every day | NASAL | 2 refills | Status: DC

## 2020-12-23 NOTE — Telephone Encounter (Signed)
Pt requesting refill on Flonase, she uses [facility]

## 2020-12-23 NOTE — Anesthesia Postprocedure Evaluation (Signed)
Anesthesia Post Note  Patient: Kristin Lambert  Procedure(s) Performed: RIGHT CARPAL TUNNEL RELEASE (Right)     Patient location during evaluation: PACU Anesthesia Type: MAC Level of consciousness: awake and alert Pain management: pain level controlled Vital Signs Assessment: post-procedure vital signs reviewed and stable Respiratory status: spontaneous breathing, nonlabored ventilation and respiratory function stable Cardiovascular status: stable and blood pressure returned to baseline Anesthetic complications: no   No notable events documented.  Last Vitals:  Vitals:   12/21/20 0915 12/21/20 0956  BP: (!) 142/60 (!) 175/79  Pulse: (!) 59 68  Resp: (!) 21 16  Temp: (!) 35.9 C 36.6 C  SpO2: 97% 99%    Last Pain:  Vitals:   12/22/20 0906  TempSrc:   PainSc: 2    Pain Goal: Patients Stated Pain Goal: 5 (12/21/20 0743)                 Audry Pili

## 2020-12-23 NOTE — Telephone Encounter (Signed)
Patient notified VIA phone. Dm/cma  

## 2021-01-02 ENCOUNTER — Other Ambulatory Visit: Payer: Self-pay

## 2021-01-02 ENCOUNTER — Encounter: Payer: Self-pay | Admitting: Orthopedic Surgery

## 2021-01-02 ENCOUNTER — Ambulatory Visit (INDEPENDENT_AMBULATORY_CARE_PROVIDER_SITE_OTHER): Payer: Medicare Other | Admitting: Orthopedic Surgery

## 2021-01-02 DIAGNOSIS — G5601 Carpal tunnel syndrome, right upper limb: Secondary | ICD-10-CM

## 2021-01-02 NOTE — Progress Notes (Signed)
° °  Post-Op Visit Note   Patient: Kristin Lambert           Date of Birth: [birth year]           MRN: [id number] Visit Date: 01/02/2021 PCP: Haydee Salter, MD   Assessment & Plan:  Chief Complaint:  Chief Complaint  Patient presents with   Right Wrist - Routine Post Op, Follow-up   Visit Diagnoses:  1. Carpal tunnel syndrome, right upper limb     Plan: Patient doing well postoperatively.  Hand pain and cramping has completely resolved.  She is no longer having any nocturnal symptoms and is sleeping through the night.  She still describes some numbness in the very tips of her fingers that was present before surgery.  She has 5/5 thenar motor strength.  Incision clean and dry.  Sutures removed.  Discussed scar massage.  She can see me again as needed.   Follow-Up Instructions: No follow-ups on file.   Orders:  No orders of the defined types were placed in this encounter.  No orders of the defined types were placed in this encounter.   Imaging: No results found.  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 no other symptoms, on ASA    Family History  Problem Relation Age of Onset   Stroke Mother    Heart disease Mother    Heart disease Sister    Breast cancer Neg  Hx     Past Surgical History:  Procedure Laterality Date   ABDOMINAL HYSTERECTOMY     BIOPSY THYROID  05/2019   CARPAL TUNNEL RELEASE Right 12/21/2020   Procedure: RIGHT CARPAL TUNNEL RELEASE;  Surgeon: Sherilyn Cooter, MD;  Location: [facility];  Service: Orthopedics;  Laterality: Right;   CESAREAN SECTION     CHOLECYSTECTOMY     HAMMER TOE SURGERY Right    KNEE REPAIR EXTENSOR MECHANISM Right    ORIF ANKLE FRACTURE Left    Social History   Occupational History   Occupation: Retired- Nursing    Comment: [place], [place]  Tobacco Use   Smoking status: Never   Smokeless tobacco: Never  Vaping Use   Vaping Use: Never used  Substance and Sexual Activity   Alcohol use: Never   Drug use: Never   Sexual activity: Not Currently

## 2021-01-11 ENCOUNTER — Other Ambulatory Visit: Payer: Self-pay

## 2021-01-11 ENCOUNTER — Ambulatory Visit (INDEPENDENT_AMBULATORY_CARE_PROVIDER_SITE_OTHER): Payer: Medicare Other | Admitting: Family Medicine

## 2021-01-11 VITALS — BP 120/66 | HR 64 | Temp 97.6°F | Ht 62.0 in | Wt 244.2 lb

## 2021-01-11 DIAGNOSIS — M170 Bilateral primary osteoarthritis of knee: Secondary | ICD-10-CM

## 2021-01-11 DIAGNOSIS — R2681 Unsteadiness on feet: Secondary | ICD-10-CM | POA: Diagnosis not present

## 2021-01-11 DIAGNOSIS — E782 Mixed hyperlipidemia: Secondary | ICD-10-CM

## 2021-01-11 DIAGNOSIS — I4891 Unspecified atrial fibrillation: Secondary | ICD-10-CM | POA: Diagnosis not present

## 2021-01-11 DIAGNOSIS — G5602 Carpal tunnel syndrome, left upper limb: Secondary | ICD-10-CM | POA: Diagnosis not present

## 2021-01-11 DIAGNOSIS — M19019 Primary osteoarthritis, unspecified shoulder: Secondary | ICD-10-CM

## 2021-01-11 DIAGNOSIS — I10 Essential (primary) hypertension: Secondary | ICD-10-CM

## 2021-01-11 LAB — COMPREHENSIVE METABOLIC PANEL
ALT: 6 U/L (ref 0–35)
AST: 11 U/L (ref 0–37)
Albumin: 3.9 g/dL (ref 3.5–5.2)
Alkaline Phosphatase: 67 U/L (ref 39–117)
BUN: 18 mg/dL (ref 6–23)
CO2: 26 mEq/L (ref 19–32)
Calcium: 9.4 mg/dL (ref 8.4–10.5)
Chloride: 103 mEq/L (ref 96–112)
Creatinine, Ser: 0.83 mg/dL (ref 0.40–1.20)
GFR: 70.97 mL/min (ref >60.00)
Glucose, Bld: 103 mg/dL — ABNORMAL HIGH (ref 70–99)
Potassium: 4 mEq/L (ref 3.5–5.1)
Sodium: 140 mEq/L (ref 135–145)
Total Bilirubin: 0.7 mg/dL (ref 0.2–1.2)
Total Protein: 6.7 g/dL (ref 6.0–8.3)

## 2021-01-11 LAB — TSH: TSH: 1.45 u[IU]/mL (ref 0.35–5.50)

## 2021-01-11 MED ORDER — METHOCARBAMOL 500 MG PO TABS: 500.0000 mg | ORAL_TABLET | Freq: Two times a day (BID) | ORAL | 0 refills | Status: DC | PRN

## 2021-01-11 NOTE — Progress Notes (Signed)
[facility] [address] [place] [address] Dept: [phone] Dept Fax: [phone]  Chronic Care Office Visit  Subjective:    Patient ID: Kristin Lambert, female    DOB: August 24, 1950, 70 y.o..   MRN: [id number]  Chief Complaint  Patient presents with   Follow-up    3 month F/u. ? About A-Fib.     History of Present Illness:  Patient is in today for reassessment of chronic medical issues.  Kristin Lambert has a history of hypertension, managed with amlodipine, losartan, and HCTZ. She is also on some occasional Lasix related to pedal edema. She recently had a right carpal tunnel release. She notes that during her preoperative exam, she was found to have atrial fibrillation. She was advised to follow-up with her PCP regarding this. She denies any new or worsening dyspnea or orthopnea. She has had some mild increase in lower leg edema that she relates to not being able to elevate her legs as much as usual since her surgery.  Kristin Lambert feels she is recovering from her surgery well. She continues to have some numbness and tingling in her right hand. She is also now starting to have issue with numbness int he left hand. She feels she is sleeping over on her left side more since her surgery. She has started using a wrist splint on this side. She also notes increasing shoulder pain, which she also attributes to sleep position.   Kristin Lambert has a history of osteoarthritis of both knee joints. She had a prior right knee joint replacement. She has pain in both knees. She uses a cane to assist with ambulation, but notes she has become more homebound out of fear of falling when she tries to leave her home. She admits to some progressive weakness in her lower extremities that she relates to having had COVID.   Kristin Lambert has a history of hyperlipidemia and is managed on simvastatin. She is not fasting today.  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 10/18/2020   Multiple thyroid nodules 10/18/2020   Morbid obesity with BMI of 45.0-49.9, adult ([place]) 11/13/2019   Vitamin D deficiency 07/30/2019   Hyperlipidemia 07/30/2019   Osteoarthritis of knees, bilateral 06/19/2019   Essential hypertension 06/19/2019   Past Surgical History:  Procedure Laterality Date   ABDOMINAL HYSTERECTOMY     BIOPSY THYROID  05/2019   CARPAL TUNNEL RELEASE Right 12/21/2020   Procedure: RIGHT CARPAL TUNNEL RELEASE;  Surgeon: Sherilyn Cooter, MD;  Location: [facility];  Service: Orthopedics;  Laterality: Right;   CESAREAN SECTION     CHOLECYSTECTOMY     HAMMER TOE SURGERY Right    KNEE REPAIR EXTENSOR MECHANISM Right    ORIF ANKLE FRACTURE Left    Family History  Problem Relation Age of Onset   Stroke Mother    Heart disease Mother    Heart disease Sister    Breast cancer Neg Hx    Outpatient Medications Prior to Visit  Medication Sig Dispense Refill   acetaminophen (TYLENOL) 500 MG tablet Take 650 mg by mouth every 6 (six) hours as needed.     amLODipine (NORVASC) 2.5 MG  tablet TAKE 1 TABLET BY MOUTH  DAILY 90 tablet 3   Ascorbic Acid (VITAMIN C) 100 MG tablet Take 100 mg by mouth daily.     aspirin EC 81 MG tablet Take 81 mg by mouth daily.     cholecalciferol (VITAMIN D3) 25 MCG (1000 UNIT) tablet Take 1,000 Units by mouth daily.     fluticasone (FLONASE) 50 MCG/ACT nasal spray Place 2 sprays into both nostrils daily. 16 g 2   furosemide (LASIX) 20 MG tablet Take 1 tablet (20 mg total) by mouth daily as needed. 30 tablet 0   hydrochlorothiazide (HYDRODIURIL) 25 MG tablet Take 1 tablet (25 mg total) by mouth daily. 90  tablet 3   losartan (COZAAR) 100 MG tablet Take 1 tablet (100 mg total) by mouth daily. 90 tablet 3   metoprolol tartrate (LOPRESSOR) 100 MG tablet TAKE 1 TABLET BY MOUTH  TWICE DAILY 180 tablet 3   Multiple Vitamin (MULTIVITAMIN) tablet Take 1 tablet by mouth daily.     oxybutynin (DITROPAN) 5 MG tablet Take 1 tablet (5 mg total) by mouth 3 (three) times daily. 270 tablet 1   potassium chloride (KLOR-CON) 10 MEQ tablet Take 1 tablet (10 mEq total) by mouth daily. 90 tablet 0   simvastatin (ZOCOR) 40 MG tablet TAKE 1 TABLET BY MOUTH AT  BEDTIME 90 tablet 3   vitamin E 1000 UNIT capsule Take 1,000 Units by mouth daily.     methocarbamol (ROBAXIN) 500 MG tablet Take 1 tablet (500 mg total) by mouth 2 (two) times daily as needed for muscle spasms. 20 tablet 0   No facility-administered medications prior to visit.   Allergies  Allergen Reactions   Nsaids Rash   Objective:   Today's Vitals   01/11/21 0900  BP: 120/66  Pulse: 64  Temp: 97.6 F (36.4 C)  TempSrc: Temporal  SpO2: 94%  Weight: 244 lb 3.2 oz (110.8 kg)  Height: 5\' 2"  (1.575 m)   Body mass index is 44.66 kg/m.   General: Well developed, well nourished. No acute distress. CV: Irregular rhythm with controlled rate. No murmurs or rubs. Pulses 2+ bilaterally, but also   irregular. Extremities: Pain with motion in shoulders and knees. Psych: Alert and oriented. Normal mood and affect.  Health Maintenance Due  Topic Date Due   Hepatitis C Screening  Never done   TETANUS/TDAP  Never done   COLONOSCOPY (Pts 45-29yrs Insurance coverage will need to be confirmed)  Never done   Zoster Vaccines- Shingrix (1 of 2) Never done   Pneumonia Vaccine 85+ Years old (1 - PCV) Never done   DEXA SCAN  Never done   COVID-19 Vaccine (4 - Booster for Pfizer series) 01/01/2020   EKG (12/16/2020) Atrial fibrillation (rate= 63) with LVH voltage criteria.   Assessment & Plan:   1. New onset atrial fibrillation [facility]) Kristin Lambert has  new-onset atrial fibrillation. This appears to not be causing any tachycardia or CHF symptoms. I will check labs to evaluate for potential underlying etiologies. I will refer her to cardiology, as she likely needs an echocardiogram. She is currently on aspirin, but I suspect she may need either cardioversion or long-term anticoagulation.   - Comprehensive metabolic panel - TSH - Ambulatory referral to Cardiology  2. Gait instability Ms. Mundo has some physical deconditioning related to her OA of her knees. This contributes to gait instability, which is causing her to become home bound. I will refer her for Home Health- PT for strengthening and gait  training.  - Ambulatory referral to [facility]  3. 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 This impacts her ability to use her cane for assistance. I will ask PT to address this as well.  - Ambulatory referral to [facility] - methocarbamol (ROBAXIN) 500 MG tablet; Take 1 tablet (500 mg total) by mouth 2 (two) times daily as needed for muscle spasms.  Dispense: 20 tablet; Refill: 0  4. Carpal tunnel syndrome of left wrist I agree with her use of a wrist splint for the left wrist. We will follow.  5. Essential hypertension Blood pressure is at goal on amlodipine, HCTZ, and losartan. We will recheck this in 3 months.  6. Mixed hyperlipidemia Had planned to check lipids, but patient is not fasting today. Continue simvastatin.  Haydee Salter, MD

## 2021-01-17 ENCOUNTER — Telehealth: Payer: Self-pay | Admitting: Family Medicine

## 2021-01-17 NOTE — Telephone Encounter (Signed)
Patient notified VIA phone. No further questions. Dm/cma  

## 2021-01-17 NOTE — Telephone Encounter (Signed)
Left message for patient to call back and schedule Medicare Annual Wellness Visit (AWV) in office.   If not able to come in office, please offer to do virtually or by telephone.  Left office number and my jabber [phone].  Last AWV:12/15/2019  Please schedule at anytime with Nurse Health Advisor.

## 2021-01-24 ENCOUNTER — Ambulatory Visit: Payer: Medicare Other | Admitting: Family Medicine

## 2021-01-26 ENCOUNTER — Telehealth: Payer: Self-pay | Admitting: Family Medicine

## 2021-01-26 NOTE — Telephone Encounter (Signed)
Spoke to [place] @ [facility] @ [phone], advised that we don't have a delay in care form and she stated that she could take a verbal to wait till next week to start PT with patient.   Gave her verbal okay and then called patient to advised that when they come out for her husband next week they will start her PT as well.   No further questions.  Dm/cma

## 2021-01-27 ENCOUNTER — Ambulatory Visit: Payer: Medicare Other | Admitting: Neurology

## 2021-01-28 DIAGNOSIS — I4891 Unspecified atrial fibrillation: Secondary | ICD-10-CM | POA: Diagnosis not present

## 2021-01-28 DIAGNOSIS — R2689 Other abnormalities of gait and mobility: Secondary | ICD-10-CM | POA: Diagnosis not present

## 2021-01-28 DIAGNOSIS — M19019 Primary osteoarthritis, unspecified shoulder: Secondary | ICD-10-CM | POA: Diagnosis not present

## 2021-01-28 DIAGNOSIS — M6281 Muscle weakness (generalized): Secondary | ICD-10-CM | POA: Diagnosis not present

## 2021-01-28 DIAGNOSIS — I10 Essential (primary) hypertension: Secondary | ICD-10-CM | POA: Diagnosis not present

## 2021-01-31 DIAGNOSIS — R2689 Other abnormalities of gait and mobility: Secondary | ICD-10-CM | POA: Diagnosis not present

## 2021-01-31 DIAGNOSIS — I4891 Unspecified atrial fibrillation: Secondary | ICD-10-CM | POA: Diagnosis not present

## 2021-01-31 DIAGNOSIS — M19019 Primary osteoarthritis, unspecified shoulder: Secondary | ICD-10-CM | POA: Diagnosis not present

## 2021-01-31 DIAGNOSIS — M6281 Muscle weakness (generalized): Secondary | ICD-10-CM | POA: Diagnosis not present

## 2021-01-31 DIAGNOSIS — I10 Essential (primary) hypertension: Secondary | ICD-10-CM | POA: Diagnosis not present

## 2021-02-01 DIAGNOSIS — L84 Corns and callosities: Secondary | ICD-10-CM | POA: Diagnosis not present

## 2021-02-01 DIAGNOSIS — B351 Tinea unguium: Secondary | ICD-10-CM | POA: Diagnosis not present

## 2021-02-03 DIAGNOSIS — I10 Essential (primary) hypertension: Secondary | ICD-10-CM | POA: Diagnosis not present

## 2021-02-03 DIAGNOSIS — R2689 Other abnormalities of gait and mobility: Secondary | ICD-10-CM | POA: Diagnosis not present

## 2021-02-03 DIAGNOSIS — M19019 Primary osteoarthritis, unspecified shoulder: Secondary | ICD-10-CM | POA: Diagnosis not present

## 2021-02-03 DIAGNOSIS — I4891 Unspecified atrial fibrillation: Secondary | ICD-10-CM | POA: Diagnosis not present

## 2021-02-03 DIAGNOSIS — M6281 Muscle weakness (generalized): Secondary | ICD-10-CM | POA: Diagnosis not present

## 2021-02-08 ENCOUNTER — Other Ambulatory Visit: Payer: Self-pay

## 2021-02-08 ENCOUNTER — Encounter: Payer: Self-pay | Admitting: Interventional Cardiology

## 2021-02-08 ENCOUNTER — Ambulatory Visit: Payer: Medicare Other | Admitting: Interventional Cardiology

## 2021-02-08 VITALS — BP 128/74 | HR 55 | Ht 62.0 in | Wt 239.0 lb

## 2021-02-08 DIAGNOSIS — I10 Essential (primary) hypertension: Secondary | ICD-10-CM

## 2021-02-08 DIAGNOSIS — E782 Mixed hyperlipidemia: Secondary | ICD-10-CM

## 2021-02-08 DIAGNOSIS — I4819 Other persistent atrial fibrillation: Secondary | ICD-10-CM

## 2021-02-08 MED ORDER — APIXABAN 5 MG PO TABS: 5.0000 mg | ORAL_TABLET | Freq: Two times a day (BID) | ORAL | 3 refills | Status: DC

## 2021-02-08 NOTE — Progress Notes (Addendum)
Cardiology Office Note   Date:  02/08/2021   ID:  Kristin Lambert, DOB 1950-09-19, MRN [id number]  PCP:  Haydee Salter, MD    No chief complaint on file.  AFib  Wt Readings from Last 3 Encounters:  02/08/21 239 lb (108.4 kg)  01/11/21 244 lb 3.2 oz (110.8 kg)  12/21/20 239 lb 10.2 oz (108.7 kg)       History of Present Illness: Kristin Lambert is a 71 y.o. female who is being seen today for the evaluation of AFib at the request of Haydee Salter, MD.  Records reviewed: "has a history of hypertension, managed with amlodipine, losartan, and HCTZ. She is also on some occasional Lasix related to pedal edema. She recently had a right carpal tunnel release. She notes that during her preoperative exam, she was found to have atrial fibrillation. She was advised to follow-up with her PCP regarding this. She denies any new or worsening dyspnea or orthopnea. She has had some mild increase in lower leg edema that she relates to not being able to elevate her legs as much as usual since her surgery."  LDL 44 in 2022.   ECG in December 2022 on a preoperative ECG revealed atrial fibrillation with controlled ventricular response.   She still had surgery.  Outpatient surgery. Rehabbing hand.  PT for shoulders as well.   She has had some rare palpitations, " a flutter" for 6-12 months.   She takes aspirin.  Had a prior unprovoked PE a few years ago.    Husband with Alzheimers.   Stress test several years (15-20) ago negative.  Denies : Chest pain. Dizziness. Leg edema. Nitroglycerin use. Orthopnea. Paroxysmal nocturnal dyspnea. Shortness of breath. Syncope.   Was previously 400lbs and has lost significant weight with weight watchers, portion control.   Past Medical History:  Diagnosis Date   Arthritis    Carpal tunnel syndrome    Hypertension    Pulmonary embolus ([place])    "several years ago" , spontaneous pain in leg after excercise class. no other symptoms, on ASA    Past Surgical  History:  Procedure Laterality Date   ABDOMINAL HYSTERECTOMY     BIOPSY THYROID  05/2019   CARPAL TUNNEL RELEASE Right 12/21/2020   Procedure: RIGHT CARPAL TUNNEL RELEASE;  Surgeon: Sherilyn Cooter, MD;  Location: [facility];  Service: Orthopedics;  Laterality: Right;   CESAREAN SECTION     CHOLECYSTECTOMY     HAMMER TOE SURGERY Right    KNEE REPAIR EXTENSOR MECHANISM Right    ORIF ANKLE FRACTURE Left      Current Outpatient Medications  Medication Sig Dispense Refill   acetaminophen (TYLENOL) 500 MG tablet Take 650 mg by mouth every 6 (six) hours as needed.     amLODipine (NORVASC) 2.5 MG tablet TAKE 1 TABLET BY MOUTH  DAILY 90 tablet 3   apixaban (ELIQUIS) 5 MG TABS tablet Take 1 tablet (5 mg total) by mouth 2 (two) times daily. 60 tablet 3   Ascorbic Acid (VITAMIN C) 100 MG tablet Take 100 mg by mouth daily.     cholecalciferol (VITAMIN D3) 25 MCG (1000 UNIT) tablet Take 1,000 Units by mouth daily.     fluticasone (FLONASE) 50 MCG/ACT nasal spray Place 2 sprays into both nostrils daily. 16 g 2   furosemide (LASIX) 20 MG tablet Take 1 tablet (20 mg total) by mouth daily as needed. 30 tablet 0   hydrochlorothiazide (HYDRODIURIL) 25 MG tablet Take  1 tablet (25 mg total) by mouth daily. 90 tablet 3   losartan (COZAAR) 100 MG tablet Take 1 tablet (100 mg total) by mouth daily. 90 tablet 3   methocarbamol (ROBAXIN) 500 MG tablet Take 1 tablet (500 mg total) by mouth 2 (two) times daily as needed for muscle spasms. 20 tablet 0   metoprolol tartrate (LOPRESSOR) 100 MG tablet TAKE 1 TABLET BY MOUTH  TWICE DAILY 180 tablet 3   Multiple Vitamin (MULTIVITAMIN) tablet Take 1 tablet by mouth daily.     oxybutynin (DITROPAN) 5 MG tablet Take 1 tablet (5 mg total) by mouth 3 (three) times daily. 270 tablet 1   potassium chloride (KLOR-CON) 10 MEQ tablet Take 1 tablet (10 mEq total) by mouth daily. 90 tablet 0   simvastatin (ZOCOR) 40 MG tablet TAKE 1 TABLET BY MOUTH AT  BEDTIME 90  tablet 3   vitamin E 1000 UNIT capsule Take 1,000 Units by mouth daily.     No current facility-administered medications for this visit.    Allergies:   Nsaids    Social History:  The patient  reports that she has never smoked. She has never used smokeless tobacco. She reports that she does not drink alcohol and does not use drugs.   Family History:  The patient's family history includes Heart disease in her brother, mother, and sister; Heart failure in her brother; Hypertension in her brother and sister; Stroke in her mother and sister.    ROS:  Please see the history of present illness.   Otherwise, review of systems are positive for stress at home from husband's illness; she has some balance issues.   All other systems are reviewed and negative.    PHYSICAL EXAM: VS:  BP 128/74    Pulse (!) 55    Ht 5' 2" (1.575 m)    Wt 239 lb (108.4 kg)    SpO2 97%    BMI 43.71 kg/m  , BMI Body mass index is 43.71 kg/m. GEN: Well nourished, well developed, in no acute distress HEENT: normal Neck: no JVD, carotid bruits, or masses Cardiac: irregularly irregular; no murmurs, rubs, or gallops,; chronic left ankle edema  Respiratory:  clear to auscultation bilaterally, normal work of breathing GI: soft, nontender, nondistended, + BS, obese MS: no deformity or atrophy Skin: warm and dry, no rash Neuro:  Strength and sensation are intact Psych: euthymic mood, full affect   EKG:   The ekg ordered today demonstrates AFib, rate controlled   Recent Labs: 06/09/2020: Hemoglobin 13.1; Platelets 271.0 01/11/2021: ALT 6; BUN 18; Creatinine, Ser 0.83; Potassium 4.0; Sodium 140; TSH 1.45   Lipid Panel    Component Value Date/Time   CHOL 118 06/09/2020 1520   TRIG 165.0 (H) 06/09/2020 1520   HDL 40.50 06/09/2020 1520   CHOLHDL 3 06/09/2020 1520   VLDL 33.0 06/09/2020 1520   LDLCALC 44 06/09/2020 1520   LDLDIRECT 47.0 07/30/2019 1650     Other studies Reviewed: Additional studies/ records that  were reviewed today with results demonstrating: .   ASSESSMENT AND PLAN:  AFib: Echocardiogram to evaluate for any structural abnormalities that would predispose to atrial fibrillation.  Start Eliquis to lower risk of stroke.  Based on echocardiogram, if her left atrial size is not dilated, would consider cardioversion.  If she has a severely dilated left atrium, then would likely defer cardioversion given her minimal symptoms.  Metoprolol is helping to control heart rate  Effectively.  We discussed the cardioversion procedure at  length.  She is familiar with this having want an endoscopy in the past. HTN: The current medical regimen is effective;  continue present plan and medications.  Avoid excessive salt. Obesity: She has lost significant weight over the years through Diet control and weight watchers.  I encouraged her to try to get to the target level of exercise below using the stationary bike that she has.  She does have balance issues which limit her walking. 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 Hyperlipidemia: Lipids well controlled on simvastatin.   CHA2DS2-VASc Score = 4   This indicates a 4.8% annual risk of stroke. The patient's score is based upon: CHF History: 0 HTN History: 1 Diabetes History: 0 Stroke History: 0 Vascular Disease History: 1 Age Score: 1 Gender Score: 1      Current medicines are reviewed at length with the patient today.  The patient concerns regarding her medicines were addressed.  The following changes have been made:  No change  Labs/ tests ordered today include:   Orders Placed This Encounter  Procedures   EKG 12-Lead   ECHOCARDIOGRAM COMPLETE    Recommend 150 minutes/week of aerobic exercise Low fat, low carb, high fiber diet recommended  Disposition:   FU in 6 months: CBC and b-Met needed in 6 months   Signed, Larae Grooms, MD  02/08/2021 11:55 AM    [facility] [address], [place], [place]  [address] Phone: [phone]; Fax: [phone]

## 2021-02-08 NOTE — Patient Instructions (Signed)
Medication Instructions:  Your physician has recommended you make the following change in your medication: Stop aspirin. Start Eliquis 5 mg by mouth twice daily  *If you need a refill on your cardiac medications before your next appointment, please call your pharmacy*   Lab Work: None If you have labs (blood work) drawn today and your tests are completely normal, you will receive your results only by: [place] (if you have MyChart) OR A paper copy in the mail If you have any lab test that is abnormal or we need to change your treatment, we will call you to review the results.   Testing/Procedures: Your physician has requested that you have an echocardiogram. Echocardiography is a painless test that uses sound waves to create images of your heart. It provides your doctor with information about the size and shape of your heart and how well your hearts chambers and valves are working. This procedure takes approximately one hour. There are no restrictions for this procedure.    Follow-Up: At [facility], you and your health needs are our priority.  As part of our continuing mission to provide you with exceptional heart care, we have created designated Provider Care Teams.  These Care Teams include your primary Cardiologist (physician) and Advanced Practice Providers (APPs -  Physician Assistants and Nurse Practitioners) who all work together to provide you with the care you need, when you need it.  We recommend signing up for the patient portal called "MyChart".  Sign up information is provided on this After Visit Summary.  MyChart is used to connect with patients for Virtual Visits (Telemedicine).  Patients are able to view lab/test results, encounter notes, upcoming appointments, etc.  Non-urgent messages can be sent to your provider as well.   To learn more about what you can do with MyChart, go to [email or web address].    Your next appointment:   6 month(s)  The format for  your next appointment:   In Person  Provider:   Larae Grooms, MD     Other Instructions

## 2021-02-09 ENCOUNTER — Telehealth: Payer: Self-pay | Admitting: Interventional Cardiology

## 2021-02-09 DIAGNOSIS — M6281 Muscle weakness (generalized): Secondary | ICD-10-CM | POA: Diagnosis not present

## 2021-02-09 DIAGNOSIS — I10 Essential (primary) hypertension: Secondary | ICD-10-CM | POA: Diagnosis not present

## 2021-02-09 DIAGNOSIS — R2689 Other abnormalities of gait and mobility: Secondary | ICD-10-CM | POA: Diagnosis not present

## 2021-02-09 DIAGNOSIS — M19019 Primary osteoarthritis, unspecified shoulder: Secondary | ICD-10-CM | POA: Diagnosis not present

## 2021-02-09 DIAGNOSIS — I4891 Unspecified atrial fibrillation: Secondary | ICD-10-CM | POA: Diagnosis not present

## 2021-02-09 NOTE — Telephone Encounter (Signed)
Spoke with the patient who states that she forgot to tell Dr. 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 She states that this was discovered a long time ago after she was hospitalized for a fall. She states that it has been monitored by Dr. Meda Coffee. She states that he told her that her circulation is still good and it could be a birth defect. She states that she has never had any symptoms or problems before. She does get yearly carotid ultrasounds. Her two most recent ultrasounds from 2021 and 2022 are in her chart. She just wanted to make sure that Dr. Irish Lack was aware and ensure that it did not change any of his plans that were discussed yesterday during her visit.

## 2021-02-09 NOTE — Telephone Encounter (Signed)
New Message:   Patient says she needs to talk to Dr Irish Lack 's nurse. She saw Dr Irish Lack yesterday and she forgot to  tell something about her history and she says it is very important

## 2021-02-10 NOTE — Telephone Encounter (Signed)
OK.  Thanks for the info. No change in plans for AFib management.

## 2021-02-10 NOTE — Telephone Encounter (Signed)
Patient informed and verbalized understanding of plan. 

## 2021-02-14 DIAGNOSIS — R2689 Other abnormalities of gait and mobility: Secondary | ICD-10-CM | POA: Diagnosis not present

## 2021-02-14 DIAGNOSIS — M19019 Primary osteoarthritis, unspecified shoulder: Secondary | ICD-10-CM | POA: Diagnosis not present

## 2021-02-14 DIAGNOSIS — M6281 Muscle weakness (generalized): Secondary | ICD-10-CM | POA: Diagnosis not present

## 2021-02-14 DIAGNOSIS — I10 Essential (primary) hypertension: Secondary | ICD-10-CM | POA: Diagnosis not present

## 2021-02-14 DIAGNOSIS — I4891 Unspecified atrial fibrillation: Secondary | ICD-10-CM | POA: Diagnosis not present

## 2021-02-17 DIAGNOSIS — R2689 Other abnormalities of gait and mobility: Secondary | ICD-10-CM | POA: Diagnosis not present

## 2021-02-17 DIAGNOSIS — M6281 Muscle weakness (generalized): Secondary | ICD-10-CM | POA: Diagnosis not present

## 2021-02-17 DIAGNOSIS — M19019 Primary osteoarthritis, unspecified shoulder: Secondary | ICD-10-CM | POA: Diagnosis not present

## 2021-02-17 DIAGNOSIS — I4891 Unspecified atrial fibrillation: Secondary | ICD-10-CM | POA: Diagnosis not present

## 2021-02-17 DIAGNOSIS — I10 Essential (primary) hypertension: Secondary | ICD-10-CM | POA: Diagnosis not present

## 2021-02-21 ENCOUNTER — Ambulatory Visit: Payer: Medicare Other

## 2021-02-21 DIAGNOSIS — R2689 Other abnormalities of gait and mobility: Secondary | ICD-10-CM | POA: Diagnosis not present

## 2021-02-21 DIAGNOSIS — M19019 Primary osteoarthritis, unspecified shoulder: Secondary | ICD-10-CM | POA: Diagnosis not present

## 2021-02-21 DIAGNOSIS — I10 Essential (primary) hypertension: Secondary | ICD-10-CM | POA: Diagnosis not present

## 2021-02-21 DIAGNOSIS — I4891 Unspecified atrial fibrillation: Secondary | ICD-10-CM | POA: Diagnosis not present

## 2021-02-21 DIAGNOSIS — M6281 Muscle weakness (generalized): Secondary | ICD-10-CM | POA: Diagnosis not present

## 2021-02-23 DIAGNOSIS — I4891 Unspecified atrial fibrillation: Secondary | ICD-10-CM | POA: Diagnosis not present

## 2021-02-23 DIAGNOSIS — M19019 Primary osteoarthritis, unspecified shoulder: Secondary | ICD-10-CM | POA: Diagnosis not present

## 2021-02-23 DIAGNOSIS — R2689 Other abnormalities of gait and mobility: Secondary | ICD-10-CM | POA: Diagnosis not present

## 2021-02-23 DIAGNOSIS — I10 Essential (primary) hypertension: Secondary | ICD-10-CM | POA: Diagnosis not present

## 2021-02-23 DIAGNOSIS — M6281 Muscle weakness (generalized): Secondary | ICD-10-CM | POA: Diagnosis not present

## 2021-03-01 ENCOUNTER — Ambulatory Visit (HOSPITAL_COMMUNITY): Payer: Medicare Other | Attending: Internal Medicine

## 2021-03-01 ENCOUNTER — Other Ambulatory Visit: Payer: Self-pay

## 2021-03-01 DIAGNOSIS — I4819 Other persistent atrial fibrillation: Secondary | ICD-10-CM

## 2021-03-01 LAB — ECHOCARDIOGRAM COMPLETE: S' Lateral: 3.2 cm

## 2021-03-02 DIAGNOSIS — M6281 Muscle weakness (generalized): Secondary | ICD-10-CM | POA: Diagnosis not present

## 2021-03-02 DIAGNOSIS — I4891 Unspecified atrial fibrillation: Secondary | ICD-10-CM | POA: Diagnosis not present

## 2021-03-02 DIAGNOSIS — M19019 Primary osteoarthritis, unspecified shoulder: Secondary | ICD-10-CM | POA: Diagnosis not present

## 2021-03-02 DIAGNOSIS — R2689 Other abnormalities of gait and mobility: Secondary | ICD-10-CM | POA: Diagnosis not present

## 2021-03-02 DIAGNOSIS — I10 Essential (primary) hypertension: Secondary | ICD-10-CM | POA: Diagnosis not present

## 2021-03-03 DIAGNOSIS — R2689 Other abnormalities of gait and mobility: Secondary | ICD-10-CM | POA: Diagnosis not present

## 2021-03-03 DIAGNOSIS — I10 Essential (primary) hypertension: Secondary | ICD-10-CM | POA: Diagnosis not present

## 2021-03-03 DIAGNOSIS — I4891 Unspecified atrial fibrillation: Secondary | ICD-10-CM | POA: Diagnosis not present

## 2021-03-03 DIAGNOSIS — M6281 Muscle weakness (generalized): Secondary | ICD-10-CM | POA: Diagnosis not present

## 2021-03-03 DIAGNOSIS — M19019 Primary osteoarthritis, unspecified shoulder: Secondary | ICD-10-CM | POA: Diagnosis not present

## 2021-03-07 DIAGNOSIS — I10 Essential (primary) hypertension: Secondary | ICD-10-CM | POA: Diagnosis not present

## 2021-03-07 DIAGNOSIS — I4891 Unspecified atrial fibrillation: Secondary | ICD-10-CM | POA: Diagnosis not present

## 2021-03-07 DIAGNOSIS — M19019 Primary osteoarthritis, unspecified shoulder: Secondary | ICD-10-CM | POA: Diagnosis not present

## 2021-03-07 DIAGNOSIS — R2689 Other abnormalities of gait and mobility: Secondary | ICD-10-CM | POA: Diagnosis not present

## 2021-03-07 DIAGNOSIS — M6281 Muscle weakness (generalized): Secondary | ICD-10-CM | POA: Diagnosis not present

## 2021-03-10 DIAGNOSIS — I4891 Unspecified atrial fibrillation: Secondary | ICD-10-CM | POA: Diagnosis not present

## 2021-03-10 DIAGNOSIS — M19019 Primary osteoarthritis, unspecified shoulder: Secondary | ICD-10-CM | POA: Diagnosis not present

## 2021-03-10 DIAGNOSIS — I10 Essential (primary) hypertension: Secondary | ICD-10-CM | POA: Diagnosis not present

## 2021-03-10 DIAGNOSIS — R2689 Other abnormalities of gait and mobility: Secondary | ICD-10-CM | POA: Diagnosis not present

## 2021-03-10 DIAGNOSIS — M6281 Muscle weakness (generalized): Secondary | ICD-10-CM | POA: Diagnosis not present

## 2021-03-14 ENCOUNTER — Other Ambulatory Visit: Payer: Self-pay | Admitting: Family

## 2021-03-14 ENCOUNTER — Ambulatory Visit: Payer: Medicare Other

## 2021-03-14 ENCOUNTER — Other Ambulatory Visit: Payer: Self-pay

## 2021-03-14 DIAGNOSIS — I10 Essential (primary) hypertension: Secondary | ICD-10-CM

## 2021-03-14 DIAGNOSIS — M6281 Muscle weakness (generalized): Secondary | ICD-10-CM | POA: Diagnosis not present

## 2021-03-14 DIAGNOSIS — R2689 Other abnormalities of gait and mobility: Secondary | ICD-10-CM | POA: Diagnosis not present

## 2021-03-14 DIAGNOSIS — M19019 Primary osteoarthritis, unspecified shoulder: Secondary | ICD-10-CM | POA: Diagnosis not present

## 2021-03-14 DIAGNOSIS — I4891 Unspecified atrial fibrillation: Secondary | ICD-10-CM | POA: Diagnosis not present

## 2021-03-14 MED ORDER — LOSARTAN POTASSIUM 100 MG PO TABS: 100.0000 mg | ORAL_TABLET | Freq: Every day | ORAL | 1 refills | Status: DC

## 2021-03-14 MED ORDER — HYDROCHLOROTHIAZIDE 25 MG PO TABS: 25.0000 mg | ORAL_TABLET | Freq: Every day | ORAL | 1 refills | Status: DC

## 2021-03-17 DIAGNOSIS — R2689 Other abnormalities of gait and mobility: Secondary | ICD-10-CM | POA: Diagnosis not present

## 2021-03-17 DIAGNOSIS — I10 Essential (primary) hypertension: Secondary | ICD-10-CM | POA: Diagnosis not present

## 2021-03-17 DIAGNOSIS — M6281 Muscle weakness (generalized): Secondary | ICD-10-CM | POA: Diagnosis not present

## 2021-03-17 DIAGNOSIS — I4891 Unspecified atrial fibrillation: Secondary | ICD-10-CM | POA: Diagnosis not present

## 2021-03-17 DIAGNOSIS — M19019 Primary osteoarthritis, unspecified shoulder: Secondary | ICD-10-CM | POA: Diagnosis not present

## 2021-03-22 ENCOUNTER — Other Ambulatory Visit: Payer: Self-pay

## 2021-03-22 MED ORDER — AMLODIPINE BESYLATE 2.5 MG PO TABS: 2.5000 mg | ORAL_TABLET | Freq: Every day | ORAL | 0 refills | Status: DC

## 2021-03-23 DIAGNOSIS — M19019 Primary osteoarthritis, unspecified shoulder: Secondary | ICD-10-CM | POA: Diagnosis not present

## 2021-03-23 DIAGNOSIS — R2689 Other abnormalities of gait and mobility: Secondary | ICD-10-CM | POA: Diagnosis not present

## 2021-03-23 DIAGNOSIS — I4891 Unspecified atrial fibrillation: Secondary | ICD-10-CM | POA: Diagnosis not present

## 2021-03-23 DIAGNOSIS — M6281 Muscle weakness (generalized): Secondary | ICD-10-CM | POA: Diagnosis not present

## 2021-03-23 DIAGNOSIS — I10 Essential (primary) hypertension: Secondary | ICD-10-CM | POA: Diagnosis not present

## 2021-03-24 ENCOUNTER — Other Ambulatory Visit: Payer: Self-pay

## 2021-03-24 MED ORDER — POTASSIUM CHLORIDE ER 10 MEQ PO TBCR: 10.0000 meq | EXTENDED_RELEASE_TABLET | Freq: Every day | ORAL | 0 refills | Status: DC

## 2021-03-28 DIAGNOSIS — R2689 Other abnormalities of gait and mobility: Secondary | ICD-10-CM | POA: Diagnosis not present

## 2021-03-28 DIAGNOSIS — I4891 Unspecified atrial fibrillation: Secondary | ICD-10-CM | POA: Diagnosis not present

## 2021-03-28 DIAGNOSIS — M6281 Muscle weakness (generalized): Secondary | ICD-10-CM | POA: Diagnosis not present

## 2021-03-28 DIAGNOSIS — M19019 Primary osteoarthritis, unspecified shoulder: Secondary | ICD-10-CM | POA: Diagnosis not present

## 2021-03-28 DIAGNOSIS — I10 Essential (primary) hypertension: Secondary | ICD-10-CM | POA: Diagnosis not present

## 2021-04-04 ENCOUNTER — Ambulatory Visit (INDEPENDENT_AMBULATORY_CARE_PROVIDER_SITE_OTHER): Payer: Medicare Other

## 2021-04-04 DIAGNOSIS — Z0100 Encounter for examination of eyes and vision without abnormal findings: Secondary | ICD-10-CM | POA: Diagnosis not present

## 2021-04-04 DIAGNOSIS — Z0000 Encounter for general adult medical examination without abnormal findings: Secondary | ICD-10-CM

## 2021-04-04 NOTE — Patient Instructions (Signed)
Ms. Kristin Lambert , ?Thank you for taking time to come for your Medicare Wellness Visit. I appreciate your ongoing commitment to your health goals. Please review the following plan we discussed and let me know if I can assist you in the future.  ? ?Screening recommendations/referrals: ?Colonoscopy: Cologuard 12/15/2019 ?Mammogram: 11/02/2020 ?Bone Density: ordered 10/18/2020 ?Recommended yearly ophthalmology/optometry visit for glaucoma screening and checkup ?Recommended yearly dental visit for hygiene and checkup ? ?Vaccinations: ?Influenza vaccine: completed  ?Pneumococcal vaccine: completed  ?Tdap vaccine: due  ?Shingles vaccine: will consider    ? ?Advanced directives: yes  ? ?Conditions/risks identified: none  ? ?Next appointment: none  ? ? ?Preventive Care 70 Years and Older, Female ?Preventive care refers to lifestyle choices and visits with your health care provider that can promote health and wellness. ?What does preventive care include? ?A yearly physical exam. This is also called an annual well check. ?Dental exams once or twice a year. ?Routine eye exams. Ask your health care provider how often you should have your eyes checked. ?Personal lifestyle choices, including: ?Daily care of your teeth and gums. ?Regular physical activity. ?Eating a healthy diet. ?Avoiding tobacco and drug use. ?Limiting alcohol use. ?Practicing safe sex. ?Taking low-dose aspirin every day. ?Taking vitamin and mineral supplements as recommended by your health care provider. ?What happens during an annual well check? ?The services and screenings done by your health care provider during your annual well check will depend on your age, overall health, lifestyle risk factors, and family history of disease. ?Counseling  ?Your health care provider may ask you questions about your: ?Alcohol use. ?Tobacco use. ?Drug use. ?Emotional well-being. ?Home and relationship well-being. ?Sexual activity. ?Eating habits. ?History of falls. ?Memory and  ability to understand (cognition). ?Work and work Statistician. ?Reproductive health. ?Screening  ?You may have the following tests or measurements: ?Height, weight, and BMI. ?Blood pressure. ?Lipid and cholesterol levels. These may be checked every 5 years, or more frequently if you are over 11 years old. ?Skin check. ?Lung cancer screening. You may have this screening every year starting at age 3 if you have a 30-pack-year history of smoking and currently smoke or have quit within the past 15 years. ?Fecal occult blood test (FOBT) of the stool. You may have this test every year starting at age 40. ?Flexible sigmoidoscopy or colonoscopy. You may have a sigmoidoscopy every 5 years or a colonoscopy every 10 years starting at age 70. ?Hepatitis C blood test. ?Hepatitis B blood test. ?Sexually transmitted disease (STD) testing. ?Diabetes screening. This is done by checking your blood sugar (glucose) after you have not eaten for a while (fasting). You may have this done every 1-3 years. ?Bone density scan. This is done to screen for osteoporosis. You may have this done starting at age 62. ?Mammogram. This may be done every 1-2 years. Talk to your health care provider about how often you should have regular mammograms. ?Talk with your health care provider about your test results, treatment options, and if necessary, the need for more tests. ?Vaccines  ?Your health care provider may recommend certain vaccines, such as: ?Influenza vaccine. This is recommended every year. ?Tetanus, diphtheria, and acellular pertussis (Tdap, Td) vaccine. You may need a Td booster every 10 years. ?Zoster vaccine. You may need this after age 73. ?Pneumococcal 13-valent conjugate (PCV13) vaccine. One dose is recommended after age 76. ?Pneumococcal polysaccharide (PPSV23) vaccine. One dose is recommended after age 30. ?Talk to your health care provider about which screenings and vaccines you need  and how often you need them. ?This information is  not intended to replace advice given to you by your health care provider. Make sure you discuss any questions you have with your health care provider. ?Document Released: 01/28/2015 Document Revised: 09/21/2015 Document Reviewed: 11/02/2014 ?Elsevier Interactive Patient Education ? 2017 [place]. ? ?Fall Prevention in the Home ?Falls can cause injuries. They can happen to people of all ages. There are many things you can do to make your home safe and to help prevent falls. ?What can I do on the outside of my home? ?Regularly fix the edges of walkways and driveways and fix any cracks. ?Remove anything that might make you trip as you walk through a door, such as a raised step or threshold. ?Trim any bushes or trees on the path to your home. ?Use bright outdoor lighting. ?Clear any walking paths of anything that might make someone trip, such as rocks or tools. ?Regularly check to see if handrails are loose or broken. Make sure that both sides of any steps have handrails. ?Any raised decks and porches should have guardrails on the edges. ?Have any leaves, snow, or ice cleared regularly. ?Use sand or salt on walking paths during winter. ?Clean up any spills in your garage right away. This includes oil or grease spills. ?What can I do in the bathroom? ?Use night lights. ?Install grab bars by the toilet and in the tub and shower. Do not use towel bars as grab bars. ?Use non-skid mats or decals in the tub or shower. ?If you need to sit down in the shower, use a plastic, non-slip stool. ?Keep the floor dry. Clean up any water that spills on the floor as soon as it happens. ?Remove soap buildup in the tub or shower regularly. ?Attach bath mats securely with double-sided non-slip rug tape. ?Do not have throw rugs and other things on the floor that can make you trip. ?What can I do in the bedroom? ?Use night lights. ?Make sure that you have a light by your bed that is easy to reach. ?Do not use any sheets or blankets that  are too big for your bed. They should not hang down onto the floor. ?Have a firm chair that has side arms. You can use this for support while you get dressed. ?Do not have throw rugs and other things on the floor that can make you trip. ?What can I do in the kitchen? ?Clean up any spills right away. ?Avoid walking on wet floors. ?Keep items that you use a lot in easy-to-reach places. ?If you need to reach something above you, use a strong step stool that has a grab bar. ?Keep electrical cords out of the way. ?Do not use floor polish or wax that makes floors slippery. If you must use wax, use non-skid floor wax. ?Do not have throw rugs and other things on the floor that can make you trip. ?What can I do with my stairs? ?Do not leave any items on the stairs. ?Make sure that there are handrails on both sides of the stairs and use them. Fix handrails that are broken or loose. Make sure that handrails are as long as the stairways. ?Check any carpeting to make sure that it is firmly attached to the stairs. Fix any carpet that is loose or worn. ?Avoid having throw rugs at the top or bottom of the stairs. If you do have throw rugs, attach them to the floor with carpet tape. ?Make sure that you  have a light switch at the top of the stairs and the bottom of the stairs. If you do not have them, ask someone to add them for you. ?What else can I do to help prevent falls? ?Wear shoes that: ?Do not have high heels. ?Have rubber bottoms. ?Are comfortable and fit you well. ?Are closed at the toe. Do not wear sandals. ?If you use a stepladder: ?Make sure that it is fully opened. Do not climb a closed stepladder. ?Make sure that both sides of the stepladder are locked into place. ?Ask someone to hold it for you, if possible. ?Clearly mark and make sure that you can see: ?Any grab bars or handrails. ?First and last steps. ?Where the edge of each step is. ?Use tools that help you move around (mobility aids) if they are needed. These  include: ?Canes. ?Walkers. ?Scooters. ?Crutches. ?Turn on the lights when you go into a dark area. Replace any light bulbs as soon as they burn out. ?Set up your furniture so you have a clear path. Avoid mov

## 2021-04-04 NOTE — Progress Notes (Signed)
? ?Subjective:  ? Kristin Lambert is a 71 y.o. female who presents for Medicare Annual (Subsequent) preventive examination. ? ? ?I connected with Kristin Lambert today by telephone and verified that I am speaking with the correct person using two identifiers. ?Location patient: home ?Location provider: work ?Persons participating in the virtual visit: patient, provider. ?  ?I discussed the limitations, risks, security and privacy concerns of performing an evaluation and management service by telephone and the availability of in person appointments. I also discussed with the patient that there may be a patient responsible charge related to this service. The patient expressed understanding and verbally consented to this telephonic visit.  ?  ?Interactive audio and video telecommunications were attempted between this provider and patient, however failed, due to patient having technical difficulties OR patient did not have access to video capability.  We continued and completed visit with audio only. ? ?  ?Review of Systems    ? ?Cardiac Risk Factors include: advanced age (>72mn, >>28women) ? ?   ?Objective:  ?  ?Today's Vitals  ? ?There is no height or weight on file to calculate BMI. ? ?Advanced Directives 04/04/2021 12/21/2020 12/15/2020 11/19/2020 12/15/2019  ?Does Patient Have a Medical Advance Directive? Yes Yes Yes Yes Yes  ?Type of [place] - [place] will Living will [place] will  ?Does patient want to make changes to medical advance directive? - No - Patient declined No - Patient declined No - Patient declined -  ?Copy of [place] Chart? No - copy requested - No - copy requested - No - copy requested  ?Would patient like information on creating a medical advance directive? - No - Patient declined - - -  ? ? ?Current Medications (verified) ?Outpatient Encounter Medications as of 04/04/2021   ?Medication Sig  ? acetaminophen (TYLENOL) 500 MG tablet Take 650 mg by mouth every 6 (six) hours as needed.  ? amLODipine (NORVASC) 2.5 MG tablet Take 1 tablet (2.5 mg total) by mouth daily.  ? apixaban (ELIQUIS) 5 MG TABS tablet Take 1 tablet (5 mg total) by mouth 2 (two) times daily.  ? Ascorbic Acid (VITAMIN C) 100 MG tablet Take 100 mg by mouth daily.  ? cholecalciferol (VITAMIN D3) 25 MCG (1000 UNIT) tablet Take 1,000 Units by mouth daily.  ? fluticasone (FLONASE) 50 MCG/ACT nasal spray Place 2 sprays into both nostrils daily.  ? furosemide (LASIX) 20 MG tablet Take 1 tablet (20 mg total) by mouth daily as needed.  ? hydrochlorothiazide (HYDRODIURIL) 25 MG tablet Take 1 tablet (25 mg total) by mouth daily.  ? losartan (COZAAR) 100 MG tablet Take 1 tablet (100 mg total) by mouth daily.  ? methocarbamol (ROBAXIN) 500 MG tablet Take 1 tablet (500 mg total) by mouth 2 (two) times daily as needed for muscle spasms.  ? metoprolol tartrate (LOPRESSOR) 100 MG tablet TAKE 1 TABLET BY MOUTH  TWICE DAILY  ? Multiple Vitamin (MULTIVITAMIN) tablet Take 1 tablet by mouth daily.  ? oxybutynin (DITROPAN) 5 MG tablet Take 1 tablet (5 mg total) by mouth 3 (three) times daily.  ? potassium chloride (KLOR-CON) 10 MEQ tablet Take 1 tablet (10 mEq total) by mouth daily.  ? simvastatin (ZOCOR) 40 MG tablet TAKE 1 TABLET BY MOUTH AT  BEDTIME  ? vitamin E 1000 UNIT capsule Take 1,000 Units by mouth daily.  ? ?No facility-administered encounter medications on file as of 04/04/2021.  ? ? ?  Allergies (verified) ?Nsaids  ? ?History: ?Past Medical History:  ?Diagnosis Date  ? Arthritis   ? Carpal tunnel syndrome   ? Hypertension   ? Pulmonary embolus ([place])   ? "several years ago" , spontaneous pain in leg after excercise class. no other symptoms, on ASA  ? ?Past Surgical History:  ?Procedure Laterality Date  ? ABDOMINAL HYSTERECTOMY    ? BIOPSY THYROID  05/2019  ? CARPAL TUNNEL RELEASE Right 12/21/2020  ? Procedure: RIGHT CARPAL TUNNEL  RELEASE;  Surgeon: Sherilyn Cooter, MD;  Location: [facility];  Service: Orthopedics;  Laterality: Right;  ? CESAREAN SECTION    ? CHOLECYSTECTOMY    ? HAMMER TOE SURGERY Right   ? KNEE REPAIR EXTENSOR MECHANISM Right   ? ORIF ANKLE FRACTURE Left   ? ?Family History  ?Problem Relation Age of Onset  ? Stroke Mother   ? Heart disease Mother   ? Heart disease Sister   ? Hypertension Sister   ? Stroke Sister   ? Heart disease Brother   ? Hypertension Brother   ? Heart failure Brother   ? Breast cancer Neg Hx   ? ?Social History  ? ?Socioeconomic History  ? Marital status: Married  ?  Spouse name: Not on file  ? Number of children: 1  ? Years of education: Not on file  ? Highest education level: Not on file  ?Occupational History  ? Occupation: Retired- Nursing  ?  Comment: [place], [place]  ?Tobacco Use  ? Smoking status: Never  ? Smokeless tobacco: Never  ?Vaping Use  ? Vaping Use: Never used  ?Substance and Sexual Activity  ? Alcohol use: Never  ? Drug use: Never  ? Sexual activity: Not Currently  ?Other Topics Concern  ? Not on file  ?Social History Narrative  ? Not on file  ? ?Social Determinants of Health  ? ?Financial Resource Strain: Low Risk   ? Difficulty of Paying Living Expenses: Not hard at all  ?Food Insecurity: No Food Insecurity  ? Worried About Charity fundraiser in the Last Year: Never true  ? Ran Out of Food in the Last Year: Never true  ?Transportation Needs: No Transportation Needs  ? Lack of Transportation (Medical): No  ? Lack of Transportation (Non-Medical): No  ?Physical Activity: Inactive  ? Days of Exercise per Week: 0 days  ? Minutes of Exercise per Session: 0 min  ?Stress: No Stress Concern Present  ? Feeling of Stress : Not at all  ?Social Connections: Moderately Isolated  ? Frequency of Communication with Friends and Family: Twice a week  ? Frequency of Social Gatherings with Friends and Family: Twice a week  ? Attends Religious Services: Never  ? Active  Member of Clubs or Organizations: No  ? Attends Archivist Meetings: Never  ? Marital Status: Married  ? ? ?Tobacco Counseling ?Counseling given: Not Answered ? ? ?Clinical Intake: ? ?Pre-visit preparation completed: Yes ? ?Pain : No/denies pain ? ?  ? ?Nutritional Risks: None ?Diabetes: No ? ?How often do you need to have someone help you when you read instructions, pamphlets, or other written materials from your doctor or pharmacy?: 1 - Never ?What is the last grade level you completed in school?: college ? ?Diabetic?no  ? ?Interpreter Needed?: No ? ?Information entered by :: [email or web address] ? ? ?Activities of Daily Living ?In your present state of health, do you have any difficulty performing the following activities: 04/04/2021 12/21/2020  ?Hearing? N  N  ?Vision? N N  ?Difficulty concentrating or making decisions? N N  ?Walking or climbing stairs? N N  ?Dressing or bathing? N N  ?Doing errands, shopping? N -  ?Preparing Food and eating ? N -  ?Using the Toilet? N -  ?In the past six months, have you accidently leaked urine? N -  ?Do you have problems with loss of bowel control? N -  ?Managing your Medications? N -  ?Managing your Finances? N -  ?Housekeeping or managing your Housekeeping? N -  ?Some recent data might be hidden  ? ? ?Patient Care Team: ?Haydee Salter, MD as PCP - General (Family Medicine) ?Jettie Booze, MD as PCP - Cardiology (Cardiology) ?Germaine Pomfret, [facility] as Pharmacist (Pharmacist) ? ?Indicate any recent Medical Services you may have received from other than [facility] providers in the past year (date may be approximate). ? ?   ?Assessment:  ? This is a routine wellness examination for [place]. ? ?Hearing/Vision screen ?No results found. ? ?Dietary issues and exercise activities discussed: ?Current Exercise Habits: The patient does not participate in regular exercise at present, Exercise limited by: orthopedic condition(s) ? ? Goals Addressed   ? ?  ?  ?  ?  ? This Visit's Progress   ?  Patient Stated   On track  ?  Eat a healthy diet & maintain current health ?  ? ?  ? ?Depression Screen ?PHQ 2/9 Scores 04/04/2021 01/11/2021 01/11/2021 12/15/2019 06/19/2019  ?PHQ - 2 Score 0 '2 1 1 1  '$ ?PHQ- 9 [place]

## 2021-04-12 ENCOUNTER — Other Ambulatory Visit: Payer: Self-pay

## 2021-04-12 ENCOUNTER — Ambulatory Visit (INDEPENDENT_AMBULATORY_CARE_PROVIDER_SITE_OTHER): Payer: Medicare Other | Admitting: Family Medicine

## 2021-04-12 VITALS — BP 118/66 | Temp 97.5°F | Ht 62.0 in | Wt 239.8 lb

## 2021-04-12 DIAGNOSIS — Z13820 Encounter for screening for osteoporosis: Secondary | ICD-10-CM

## 2021-04-12 DIAGNOSIS — E559 Vitamin D deficiency, unspecified: Secondary | ICD-10-CM

## 2021-04-12 DIAGNOSIS — Z1159 Encounter for screening for other viral diseases: Secondary | ICD-10-CM | POA: Diagnosis not present

## 2021-04-12 DIAGNOSIS — N951 Menopausal and female climacteric states: Secondary | ICD-10-CM

## 2021-04-12 DIAGNOSIS — I10 Essential (primary) hypertension: Secondary | ICD-10-CM

## 2021-04-12 DIAGNOSIS — M170 Bilateral primary osteoarthritis of knee: Secondary | ICD-10-CM

## 2021-04-12 DIAGNOSIS — I4819 Other persistent atrial fibrillation: Secondary | ICD-10-CM

## 2021-04-12 DIAGNOSIS — E782 Mixed hyperlipidemia: Secondary | ICD-10-CM

## 2021-04-12 DIAGNOSIS — F439 Reaction to severe stress, unspecified: Secondary | ICD-10-CM

## 2021-04-12 LAB — COMPREHENSIVE METABOLIC PANEL
ALT: 9 U/L (ref 0–35)
AST: 13 U/L (ref 0–37)
Albumin: 4.4 g/dL (ref 3.5–5.2)
Alkaline Phosphatase: 66 U/L (ref 39–117)
BUN: 25 mg/dL — ABNORMAL HIGH (ref 6–23)
CO2: 24 mEq/L (ref 19–32)
Calcium: 9.6 mg/dL (ref 8.4–10.5)
Chloride: 104 mEq/L (ref 96–112)
Creatinine, Ser: 0.87 mg/dL (ref 0.40–1.20)
GFR: 66.96 mL/min (ref >60.00)
Glucose, Bld: 106 mg/dL — ABNORMAL HIGH (ref 70–99)
Potassium: 3.8 mEq/L (ref 3.5–5.1)
Sodium: 140 mEq/L (ref 135–145)
Total Bilirubin: 0.6 mg/dL (ref 0.2–1.2)
Total Protein: 7.4 g/dL (ref 6.0–8.3)

## 2021-04-12 LAB — LIPID PANEL
Cholesterol: 111 mg/dL (ref 0–200)
HDL: 42.2 mg/dL (ref >39.00)
LDL Cholesterol: 44 mg/dL (ref 0–99)
NonHDL: 68.73
Total CHOL/HDL Ratio: 3
Triglycerides: 124 mg/dL (ref 0.0–149.0)
VLDL: 24.8 mg/dL (ref 0.0–40.0)

## 2021-04-12 LAB — VITAMIN D 25 HYDROXY (VIT D DEFICIENCY, FRACTURES): VITD: 46.67 ng/mL (ref 30.00–100.00)

## 2021-04-12 NOTE — Progress Notes (Signed)
?[facility] ?[facility] ?[place] ?[place] [address] ?Dept: [phone] ?Dept Fax: [phone] ? ?Chronic Care Office Visit ? ?Subjective:  ? ? Patient ID: Kristin Lambert, female    DOB: [birth year], 71 y.o..   MRN: [id number] ? ?Chief Complaint  ?Patient presents with  ? Follow-up  ?  3 month f/u. No concerns.   ? ? ?History of Present Illness: ? ?Patient is in today for reassessment of chronic medical issues. ? ?Kristin Lambert has a history of hypertension, managed with amlodipine, losartan, and HCTZ. She is also on some occasional Lasix related to pedal edema and takes a potassium supplement.  ? ?Kristin Lambert has a history of atrial fibrillation that was discovered when she went in for carpal tunnel surgery this Fall. She has been seen by Dr. Irish Lack (cardiology). Currently, her rate has been well controlled. He performed an echocardiogram and offered cardioversion, but Kristin Lambert declined to move ahead with this. ?  ?Kristin Lambert has a history of osteoarthritis of both knee joints. She had a prior right knee joint replacement. She has pain in both knees. She uses a cane to assist with ambulation, but notes she has become more homebound out of fear of falling when she tries to leave her home. She is working with physical therapy and feels this has helped. ?  ?Kristin Lambert has a history of hyperlipidemia and is managed on simvastatin.  ? ?[name]'s husband has gradually worsening issues with dementia and with mobility. [name] describes a recent fall at home (two weeks ago), where he fell on top of her. She had to call EMS to come help her and her husband get up. Kristin Lambert notes that her daughter has been pushing for her to go into assisted living. She wants to avoid that. She is concerned with the financial costs and the loss of independence that this would mean for her. They have talked with a Education officer, museum about this. She does have Home Health coming in to assist with her  husband. We recently requested ti increase this from 3 to 5 days a week. ? ?Past Medical History: ?Patient Active Problem List  ? Diagnosis Date Noted  ? Atrial fibrillation ([place]) 01/11/2021  ? Allergic rhinitis 10/18/2020  ? Pedal edema 10/18/2020  ? Stress incontinence 10/18/2020  ? Carpal tunnel syndrome of left wrist 10/18/2020  ? 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 Multiple thyroid nodules 10/18/2020  ? Morbid obesity with BMI of 45.0-49.9, adult ([place]) 11/13/2019  ? Vitamin D deficiency 07/30/2019  ? Hyperlipidemia 07/30/2019  ? Osteoarthritis of knees, bilateral 06/19/2019  ? Essential hypertension 06/19/2019  ? ?Past Surgical History:  ?Procedure Laterality Date  ? ABDOMINAL HYSTERECTOMY    ? BIOPSY THYROID  05/2019  ? CARPAL TUNNEL RELEASE Right 12/21/2020  ? Procedure: RIGHT CARPAL TUNNEL RELEASE;  Surgeon: Sherilyn Cooter, MD;  Location: [facility];  Service: Orthopedics;  Laterality: Right;  ? CESAREAN SECTION    ? CHOLECYSTECTOMY    ? HAMMER TOE SURGERY Right   ? KNEE REPAIR EXTENSOR MECHANISM Right   ? ORIF ANKLE FRACTURE Left   ? ?Family History  ?Problem Relation Age of Onset  ? Stroke Mother   ? Heart disease Mother   ? Heart disease Sister   ? Hypertension Sister   ? Stroke Sister   ? Heart disease Brother   ? Hypertension Brother   ? Heart failure Brother   ? Breast cancer Neg Hx   ? ?  Outpatient Medications Prior to Visit  ?Medication Sig Dispense Refill  ? acetaminophen (TYLENOL) 500 MG tablet Take 650 mg by mouth every 6 (six) hours as needed.    ? amLODipine (NORVASC) 2.5 MG tablet Take 1 tablet (2.5 mg total) by mouth daily. 90 tablet 0  ? apixaban (ELIQUIS) 5 MG TABS tablet Take 1 tablet (5 mg total) by mouth 2 (two) times daily. 60 tablet 3  ? Ascorbic Acid (VITAMIN C) 100 MG tablet Take 100 mg by mouth daily.    ? cholecalciferol (VITAMIN D3) 25 MCG (1000 UNIT) tablet Take 1,000 Units by mouth daily.    ? fluticasone (FLONASE) 50 MCG/ACT nasal spray  Place 2 sprays into both nostrils daily. 16 g 2  ? furosemide (LASIX) 20 MG tablet Take 1 tablet (20 mg total) by mouth daily as needed. 30 tablet 0  ? hydrochlorothiazide (HYDRODIURIL) 25 MG tablet Take 1 tablet (25 mg total) by mouth daily. 90 tablet 1  ? losartan (COZAAR) 100 MG tablet Take 1 tablet (100 mg total) by mouth daily. 90 tablet 1  ? methocarbamol (ROBAXIN) 500 MG tablet Take 1 tablet (500 mg total) by mouth 2 (two) times daily as needed for muscle spasms. 20 tablet 0  ? metoprolol tartrate (LOPRESSOR) 100 MG tablet TAKE 1 TABLET BY MOUTH  TWICE DAILY 180 tablet 3  ? Multiple Vitamin (MULTIVITAMIN) tablet Take 1 tablet by mouth daily.    ? oxybutynin (DITROPAN) 5 MG tablet Take 1 tablet (5 mg total) by mouth 3 (three) times daily. 270 tablet 1  ? potassium chloride (KLOR-CON) 10 MEQ tablet Take 1 tablet (10 mEq total) by mouth daily. 90 tablet 0  ? simvastatin (ZOCOR) 40 MG tablet TAKE 1 TABLET BY MOUTH AT  BEDTIME 90 tablet 3  ? vitamin E 1000 UNIT capsule Take 1,000 Units by mouth daily.    ? ?No facility-administered medications prior to visit.  ? ?Allergies  ?Allergen Reactions  ? Nsaids Rash  ?  ?Objective:  ? ?Today's Vitals  ? 04/12/21 0900  ?BP: 118/66  ?Temp: (!) 97.5 ?F (36.4 ?C)  ?TempSrc: Temporal  ?SpO2: 98%  ?Weight: 239 lb 12.8 oz (108.8 kg)  ?Height: '5\' 2"'$  (1.575 m)  ? ?Body mass index is 43.86 kg/m?.  ? ?General: Well developed, well nourished. No acute distress. ?CV: Irregularly irregular rhythm without murmurs or rubs. Pulses 2+ bilaterally. ?Extremities: Trace to 1+edema. ?Psych: Alert and oriented. Normal mood and affect. ? ?Health Maintenance Due  ?Topic Date Due  ? Hepatitis C Screening  Never done  ? TETANUS/TDAP  Never done  ? Zoster Vaccines- Shingrix (1 of 2) Never done  ? Pneumonia Vaccine 69+ Years old (1 - PCV) Never done  ? DEXA SCAN  Never done  ? ?Imaging: ?Echocardiogram (03/01/2021) ?IMPRESSIONS  ? 1. Left ventricular ejection fraction, by estimation, is 55 to 60%. The  left ventricle has normal function. The left ventricle has no regional wall motion abnormalities. Left ventricular diastolic parameters are  ?indeterminate.  ? 2. Right ventricular systolic function is low normal. The right ventricular size is normal.  ? 3. Left atrial size was mildly dilated.  ? 4. The mitral valve is normal in structure. Trivial mitral valve regurgitation.  ? 5. The aortic valve is tricuspid. Aortic valve regurgitation is not visualized. Aortic valve sclerosis is present, with no evidence of aortic valve stenosis.  ? 6. The inferior vena cava is normal in size with greater than 50% respiratory variability, suggesting right atrial pressure  of 3 mmHg.  ?   ?Assessment & Plan:  ? ?1. Essential hypertension ?Blood pressure is at goal. Continue amlodipine, HCTZ, and losartan. ? ?- Comprehensive metabolic panel ? ?2. Persistent atrial fibrillation ([place]) ?Still in a. fib based on auscultation. We did discuss that the mildly dilated atrium would not preclude an attempt at cardioversion. As she is not symptomatic, Ms. Lietzke prefers to avoid this for now. We will continue Eliquis 5 mg daily for now. ? ?- Comprehensive metabolic panel ? ?3. Mixed hyperlipidemia ?Due for repeat lipids to assess the adequacy of her simvastatin 40 mg daily. ? ?- Lipid panel ? ?4. Primary osteoarthritis of both knees ?Continue to use a cane for assistance. Continue with PT for strengthening. ? ?5. Stress at home ?We discussed Ms. Makarewicz's feelings towards continuing to live independently. She does have some assistance from home health. She has cameras in the home that would allow her to reach her daughter when needed in an emergency. She is engaged with a Education officer, museum. These supports appear adequate for now.  ? ?6. Vitamin D deficiency ?Due for reassessment. ? ?- VITAMIN D 25 Hydroxy (Vit-D Deficiency, Fractures) ? ?7. Encounter for hepatitis C screening test for low risk patient ? ?- HCV Ab w Reflex to Quant PCR ? ?8. Screening  for osteoporosis ?9. Menopausal state ? ?- DG Bone Density; Future ? ?Return in about 3 months (around 07/13/2021) for Reassessment.  ? ?Haydee Salter, MD ?

## 2021-04-13 LAB — HCV AB W REFLEX TO QUANT PCR: HCV Ab: NONREACTIVE

## 2021-04-13 LAB — HCV INTERPRETATION

## 2021-04-14 ENCOUNTER — Telehealth (HOSPITAL_BASED_OUTPATIENT_CLINIC_OR_DEPARTMENT_OTHER): Payer: Self-pay

## 2021-04-24 ENCOUNTER — Other Ambulatory Visit: Payer: Self-pay | Admitting: Family Medicine

## 2021-04-27 ENCOUNTER — Other Ambulatory Visit: Payer: Self-pay | Admitting: Family Medicine

## 2021-05-01 ENCOUNTER — Telehealth: Payer: Self-pay | Admitting: Family Medicine

## 2021-05-01 DIAGNOSIS — Z13820 Encounter for screening for osteoporosis: Secondary | ICD-10-CM

## 2021-05-01 DIAGNOSIS — N951 Menopausal and female climacteric states: Secondary | ICD-10-CM

## 2021-05-01 NOTE — Telephone Encounter (Signed)
Pt is wanting her bone density order sent to [facility] ?[address]     [place],  [place]  [address] ?Main: [phone] ?

## 2021-05-02 NOTE — Telephone Encounter (Signed)
Spoke to patient, advised her that Dr Gena Fray is out of the office and will let him order the Bone density again due to needing either Weds or Friday's.  She is okay with waiting till he is back on 05/08/21. Dm/cma ? ?

## 2021-05-05 DIAGNOSIS — B351 Tinea unguium: Secondary | ICD-10-CM | POA: Diagnosis not present

## 2021-05-05 DIAGNOSIS — L84 Corns and callosities: Secondary | ICD-10-CM | POA: Diagnosis not present

## 2021-05-08 NOTE — Addendum Note (Signed)
Addended by: Haydee Salter on: 05/08/2021 11:58 AM ? ? Modules accepted: Orders ? ?

## 2021-06-21 ENCOUNTER — Other Ambulatory Visit: Payer: Self-pay | Admitting: Family Medicine

## 2021-06-21 DIAGNOSIS — J302 Other seasonal allergic rhinitis: Secondary | ICD-10-CM

## 2021-06-24 ENCOUNTER — Other Ambulatory Visit: Payer: Self-pay | Admitting: Family Medicine

## 2021-06-24 DIAGNOSIS — J302 Other seasonal allergic rhinitis: Secondary | ICD-10-CM

## 2021-07-03 ENCOUNTER — Other Ambulatory Visit: Payer: Self-pay | Admitting: Interventional Cardiology

## 2021-07-03 DIAGNOSIS — I4819 Other persistent atrial fibrillation: Secondary | ICD-10-CM

## 2021-07-03 NOTE — Telephone Encounter (Signed)
Eliquis '5mg'$  refill request received. Patient is 71 years old, weight-108.8kg, Crea- 0.87 on 04/12/2021, Diagnosis-Afib, and last seen by Dr. Irish Lack on 02/08/2021. Dose is appropriate based on dosing criteria. Will send in refill to requested pharmacy.

## 2021-07-14 ENCOUNTER — Ambulatory Visit (INDEPENDENT_AMBULATORY_CARE_PROVIDER_SITE_OTHER): Payer: Medicare Other | Admitting: Family Medicine

## 2021-07-14 VITALS — BP 118/70 | HR 54 | Temp 97.5°F | Ht 62.0 in | Wt 242.4 lb

## 2021-07-14 DIAGNOSIS — R5383 Other fatigue: Secondary | ICD-10-CM

## 2021-07-14 DIAGNOSIS — N393 Stress incontinence (female) (male): Secondary | ICD-10-CM

## 2021-07-14 DIAGNOSIS — G4709 Other insomnia: Secondary | ICD-10-CM | POA: Diagnosis not present

## 2021-07-14 DIAGNOSIS — E782 Mixed hyperlipidemia: Secondary | ICD-10-CM

## 2021-07-14 DIAGNOSIS — I10 Essential (primary) hypertension: Secondary | ICD-10-CM

## 2021-07-14 DIAGNOSIS — I4819 Other persistent atrial fibrillation: Secondary | ICD-10-CM | POA: Diagnosis not present

## 2021-07-14 LAB — CBC
HCT: 38.4 % (ref 36.0–46.0)
Hemoglobin: 12.3 g/dL (ref 12.0–15.0)
MCHC: 32.1 g/dL (ref 30.0–36.0)
MCV: 90.8 fl (ref 78.0–100.0)
Platelets: 235 10*3/uL (ref 150.0–400.0)
RBC: 4.23 Mil/uL (ref 3.87–5.11)
RDW: 14.2 % (ref 11.5–15.5)
WBC: 9.9 10*3/uL (ref 4.0–10.5)

## 2021-07-14 LAB — TSH: TSH: 1.3 u[IU]/mL (ref 0.35–5.50)

## 2021-07-14 MED ORDER — OXYBUTYNIN CHLORIDE 5 MG PO TABS: 5.0000 mg | ORAL_TABLET | Freq: Four times a day (QID) | ORAL | 1 refills | Status: DC

## 2021-07-14 NOTE — Progress Notes (Signed)
[facility] [address] [place] [address] Dept: [phone] Dept Fax: [phone]  Chronic Care Office Visit  Subjective:    Patient ID: Kristin Lambert, female    DOB: 26-May-1950, 71 y.o..   MRN: [id number]  Chief Complaint  Patient presents with   Follow-up    3 month f/u.  C/o still having some urine incontinence issues.  Fatigue/leg     History of Present Illness:  Patient is in today for reassessment of chronic medical issues.  Kristin Lambert has a history of hypertension, managed with amlodipine 2.5 mg daily, losartan 100 mg daily, and HCTZ 25 mg daily. She is also on some occasional Lasix related to pedal edema and takes a potassium supplement. He edema has been doing well lately.   Kristin Lambert has a history of atrial fibrillation. She has been seen by Dr. Irish Lack (cardiology). Currently, her rate has been well controlled. He has offered cardioversion. Kristin Lambert is on apixiban 5 mg daily for anticoagulation.   Kristin Lambert has a history of hyperlipidemia and is managed on simvastatin 40 mg dialy.    [name] husband has gradually worsening issues with dementia and with mobility. He now has Hospice care, who is assisting Kristin Lambert with some in-home care. She can pay caretakers to watch him for 1-2 hours, but is otherwise managing all of this on her own. She admits that this has been a stress on her. She finds she is feeling fatigued. She also notes she is not sleeping well. She gets up to urinate 1-2 times a night, but then has trouble getting back to sleep. She admits to worries about sleeping too deeply and not hearing when her husband needs help. She thought about taking melatonin, but was afraid that this might be too much for her.  Kristin Lambert has been on oxybutynin for many years related to stress incontinence. She has had some increased leaking in the past few months. She has started back to doing Kegel exercises and she is  trying to reduce her fluid intake in the evenings to reduce nighttime voiding.  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 10/18/2020   Multiple thyroid nodules 10/18/2020   Morbid obesity with BMI of 45.0-49.9, adult ([place]) 11/13/2019   Vitamin D deficiency 07/30/2019   Hyperlipidemia 07/30/2019   Osteoarthritis of knees, bilateral 06/19/2019   Essential hypertension 06/19/2019   Past Surgical History:  Procedure Laterality Date   ABDOMINAL HYSTERECTOMY     BIOPSY THYROID  05/2019   CARPAL TUNNEL RELEASE Right 12/21/2020   Procedure: RIGHT CARPAL TUNNEL RELEASE;  Surgeon: Sherilyn Cooter, MD;  Location: [facility];  Service: Orthopedics;  Laterality: Right;   CESAREAN SECTION     CHOLECYSTECTOMY     HAMMER TOE SURGERY Right    KNEE REPAIR EXTENSOR MECHANISM Right    ORIF ANKLE FRACTURE Left    Family History  Problem Relation Age of Onset   Stroke Mother    Heart disease Mother    Heart disease Sister    Hypertension Sister    Stroke Sister    Heart disease Brother    Hypertension Brother    Heart failure Brother    Breast cancer Neg Hx  Outpatient Medications Prior to Visit  Medication Sig Dispense Refill   acetaminophen (TYLENOL) 500 MG tablet Take 650 mg by mouth every 6 (six) hours as needed.     amLODipine (NORVASC) 2.5 MG tablet TAKE 1 TABLET BY MOUTH DAILY 90 tablet 3   apixaban (ELIQUIS) 5 MG TABS tablet TAKE 1 TABLET BY MOUTH TWICE A DAY 60 tablet 6   Ascorbic Acid (VITAMIN C) 100 MG tablet Take 100 mg by mouth daily.     cholecalciferol (VITAMIN D3) 25 MCG (1000 UNIT) tablet Take 1,000 Units by mouth daily.     fluticasone (FLONASE) 50 MCG/ACT nasal spray USE 2 SPRAYS IN BOTH NOSTRILS   DAILY 48 g 2   furosemide (LASIX) 20 MG tablet Take 1 tablet (20 mg total) by mouth daily as needed. 30 tablet 0   hydrochlorothiazide (HYDRODIURIL) 25 MG tablet Take 1 tablet (25 mg total) by mouth daily. 90 tablet 1   losartan (COZAAR) 100 MG tablet Take 1 tablet (100 mg total) by mouth daily. 90 tablet 1   methocarbamol (ROBAXIN) 500 MG tablet Take 1 tablet (500 mg total) by mouth 2 (two) times daily as needed for muscle spasms. 20 tablet 0   metoprolol tartrate (LOPRESSOR) 100 MG tablet TAKE 1 TABLET BY MOUTH  TWICE DAILY 180 tablet 3   Multiple Vitamin (MULTIVITAMIN) tablet Take 1 tablet by mouth daily.     potassium chloride (KLOR-CON) 10 MEQ tablet TAKE 1 TABLET BY MOUTH DAILY 90 tablet 3   simvastatin (ZOCOR) 40 MG tablet TAKE 1 TABLET BY MOUTH AT  BEDTIME 90 tablet 3   vitamin E 1000 UNIT capsule Take 1,000 Units by mouth daily.     oxybutynin (DITROPAN) 5 MG tablet Take 1 tablet (5 mg total) by mouth 3 (three) times daily. 270 tablet 1   Lambert facility-administered medications prior to visit.   Allergies  Allergen Reactions   Nsaids Rash    Objective:   Today's Vitals   07/14/21 1106  BP: 118/70  Pulse: (!) 54  Temp: (!) 97.5 F (36.4 C)  TempSrc: Temporal  SpO2: 96%  Weight: 242 lb 6.4 oz (110 kg)  Height: '5\' 2"'$  (1.575 m)   Body mass index is 44.34 kg/m.   General: Well developed, well nourished. Lambert acute distress. Extremities: 1-2+ edema of the left lower leg, but only a trace on the right. Skin: Warm and dry. Lambert rashes. Neuro: CN II-XII intact. Normal sensation and DTR bilaterally. Psych: Alert and oriented. Normal mood and affect.  Health Maintenance Due  Topic Date Due   TETANUS/TDAP  Never done   DEXA SCAN  Never done   Zoster Vaccines- Shingrix (2 of 2) 11/17/2016     Assessment & Plan:   1. Essential hypertension Blood pressure is at goal. Continue amlodipine 2.5 mg daily, losartan 100 mg daily, and HCTZ 25 mg daily.  2. Persistent atrial fibrillation  (HCC) Good rate control. Continue apixiban 5 mg daily. We did discuss that her a fib could contribute to fatigue. Her moving ahead with cardioversion may help this.  3. Mixed hyperlipidemia At goal. Continue simvastatin 40 mg dialy.  4. Stress incontinence We will increase her oxybutynin to 4 times a day and see if this will help improve control. If not, I will consider switching ot Ditropan. She should continue her Kegels.  - oxybutynin (DITROPAN) 5 MG tablet; Take 1 tablet (5 mg total) by mouth 4 (four) times daily.  Dispense: 360 tablet; Refill: 1  5. Fatigue,  unspecified type We discussed the likely multifactorial nature of her fatigue. I will check some screening labs. I did encourage her to seek assistance form family or Hospice for some brief respites for her to allow her to continue to effectively support her husband.  - TSH - CBC  6. Other insomnia Likely due to stressors. I feel it would be okay for her to try melatonin, but ultimately, she would benefit from some episodes of respite.   Return in about 3 months (around 10/14/2021) for Reassessment.   Haydee Salter, MD

## 2021-07-25 ENCOUNTER — Other Ambulatory Visit (HOSPITAL_COMMUNITY): Payer: Self-pay | Admitting: Family Medicine

## 2021-07-25 DIAGNOSIS — I6523 Occlusion and stenosis of bilateral carotid arteries: Secondary | ICD-10-CM

## 2021-08-02 ENCOUNTER — Ambulatory Visit (HOSPITAL_COMMUNITY)
Admission: RE | Admit: 2021-08-02 | Discharge: 2021-08-02 | Disposition: A | Discharge status: Home / Self Care | Payer: Medicare Other | Source: Ambulatory Visit | Attending: Cardiovascular Disease | Admitting: Cardiovascular Disease

## 2021-08-02 ENCOUNTER — Encounter (HOSPITAL_COMMUNITY): Payer: Medicare Other

## 2021-08-02 DIAGNOSIS — I6523 Occlusion and stenosis of bilateral carotid arteries: Secondary | ICD-10-CM | POA: Diagnosis not present

## 2021-08-09 DIAGNOSIS — L84 Corns and callosities: Secondary | ICD-10-CM | POA: Diagnosis not present

## 2021-08-09 DIAGNOSIS — B351 Tinea unguium: Secondary | ICD-10-CM | POA: Diagnosis not present

## 2021-08-10 ENCOUNTER — Telehealth: Payer: Self-pay | Admitting: Family Medicine

## 2021-08-10 DIAGNOSIS — N393 Stress incontinence (female) (male): Secondary | ICD-10-CM

## 2021-08-10 MED ORDER — OXYBUTYNIN CHLORIDE 5 MG PO TABS: 5.0000 mg | ORAL_TABLET | Freq: Four times a day (QID) | ORAL | 1 refills | Status: DC

## 2021-08-10 NOTE — Telephone Encounter (Signed)
Spoke to patient and she never picked up the RX that was sent into the pharmacy. RX sent to mail order for her.  Dm/cma

## 2021-08-10 NOTE — Telephone Encounter (Signed)
Caller Name: Louvinia Call back phone #: [phone]  Reason for Call: She needs her oxybutynin refilled and changed to [facility].

## 2021-08-19 ENCOUNTER — Other Ambulatory Visit: Payer: Self-pay | Admitting: Family Medicine

## 2021-10-16 ENCOUNTER — Encounter: Payer: Self-pay | Admitting: Family Medicine

## 2021-10-16 ENCOUNTER — Ambulatory Visit (INDEPENDENT_AMBULATORY_CARE_PROVIDER_SITE_OTHER): Payer: Medicare Other | Admitting: Family Medicine

## 2021-10-16 VITALS — BP 126/70 | HR 86 | Temp 97.4°F | Ht 62.0 in | Wt 249.0 lb

## 2021-10-16 DIAGNOSIS — Z23 Encounter for immunization: Secondary | ICD-10-CM

## 2021-10-16 DIAGNOSIS — I10 Essential (primary) hypertension: Secondary | ICD-10-CM | POA: Diagnosis not present

## 2021-10-16 DIAGNOSIS — I4819 Other persistent atrial fibrillation: Secondary | ICD-10-CM | POA: Diagnosis not present

## 2021-10-16 DIAGNOSIS — I6523 Occlusion and stenosis of bilateral carotid arteries: Secondary | ICD-10-CM | POA: Diagnosis not present

## 2021-10-16 DIAGNOSIS — E782 Mixed hyperlipidemia: Secondary | ICD-10-CM

## 2021-10-16 DIAGNOSIS — E042 Nontoxic multinodular goiter: Secondary | ICD-10-CM | POA: Diagnosis not present

## 2021-10-16 DIAGNOSIS — N393 Stress incontinence (female) (male): Secondary | ICD-10-CM

## 2021-10-16 NOTE — Progress Notes (Signed)
[facility] [address] [place] [address] Dept: [phone] Dept Fax: [phone]  Chronic Care Office Visit  Subjective:    Patient ID: Kristin Lambert, female    DOB: [birth year], 71 y.o..   MRN: [id number]  Chief Complaint  Patient presents with   Follow-up    3 month f/u.  No concerns.   Wants flu shot.     History of Present Illness:  Patient is in today for reassessment of chronic medical issues.  [name] has a history of hypertension, managed with amlodipine 2.5 mg daily, losartan 100 mg daily, and HCTZ 25 mg daily. She is also on some occasional Lasix related to pedal edema and takes a potassium supplement.   [name] has a history of atrial fibrillation. She has been seen by Dr. Irish Lack (cardiology). Currently, her rate has been well controlled. He has offered cardioversion. [name] is on apixiban (Eliquis) 5 mg daily for anticoagulation.   [name] has a history of hyperlipidemia and is managed on simvastatin 40 mg dialy.    [name] husband has gradually worsening issues with dementia and with mobility, under Hospice care. She does some have some -in-home assistance, but still finds it hard to get the time to really care for herself well.   [name] has a history of stress incontinence. She is managed on oxybutynin 5 mg qid. She notes that when she takes this consistently, does her Kegel exercises, and restricts her fluids in the evening, this does help to reduce nighttime voiding.   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 10/18/2020   Multiple thyroid nodules 10/18/2020   Morbid obesity with BMI of 45.0-49.9, adult ([place]) 11/13/2019   Vitamin  D deficiency 07/30/2019   Hyperlipidemia 07/30/2019   Osteoarthritis of knees, bilateral 06/19/2019   Essential hypertension 06/19/2019   Past Surgical History:  Procedure Laterality Date   ABDOMINAL HYSTERECTOMY     BIOPSY THYROID  05/2019   CARPAL TUNNEL RELEASE Right 12/21/2020   Procedure: RIGHT CARPAL TUNNEL RELEASE;  Surgeon: Sherilyn Cooter, MD;  Location: [facility];  Service: Orthopedics;  Laterality: Right;   CESAREAN SECTION     CHOLECYSTECTOMY     HAMMER TOE SURGERY Right    KNEE REPAIR EXTENSOR MECHANISM Right    ORIF ANKLE FRACTURE Left    Family History  Problem Relation Age of Onset   Stroke Mother    Heart disease Mother    Heart disease Sister    Hypertension Sister    Stroke Sister    Heart disease Brother    Hypertension Brother    Heart failure Brother    Breast cancer Neg Hx    Outpatient Medications Prior to Visit  Medication Sig Dispense Refill   acetaminophen (TYLENOL) 500 MG tablet Take 650 mg by mouth every 6 (six) hours as needed.     amLODipine (NORVASC) 2.5 MG tablet TAKE 1 TABLET BY MOUTH DAILY 90 tablet 3   apixaban (ELIQUIS) 5 MG TABS tablet TAKE 1 TABLET BY MOUTH TWICE A DAY 60 tablet 6   Ascorbic Acid (VITAMIN C) 100 MG tablet Take 100 mg by mouth daily.     cholecalciferol (VITAMIN D3) 25 MCG (1000 UNIT) tablet  Take 1,000 Units by mouth daily.     fluticasone (FLONASE) 50 MCG/ACT nasal spray USE 2 SPRAYS IN BOTH NOSTRILS  DAILY 48 g 2   furosemide (LASIX) 20 MG tablet Take 1 tablet (20 mg total) by mouth daily as needed. 30 tablet 0   hydrochlorothiazide (HYDRODIURIL) 25 MG tablet TAKE 1 TABLET BY MOUTH DAILY 90 tablet 3   losartan (COZAAR) 100 MG tablet Take 1 tablet (100 mg total) by mouth daily. 90 tablet 1   methocarbamol (ROBAXIN) 500 MG tablet Take 1 tablet (500 mg total) by mouth 2 (two) times daily as needed for muscle spasms. 20 tablet 0   metoprolol tartrate (LOPRESSOR) 100 MG tablet TAKE 1 TABLET BY MOUTH  TWICE  DAILY 180 tablet 3   Multiple Vitamin (MULTIVITAMIN) tablet Take 1 tablet by mouth daily.     oxybutynin (DITROPAN) 5 MG tablet Take 1 tablet (5 mg total) by mouth 4 (four) times daily. 360 tablet 1   potassium chloride (KLOR-CON) 10 MEQ tablet TAKE 1 TABLET BY MOUTH DAILY 90 tablet 3   simvastatin (ZOCOR) 40 MG tablet TAKE 1 TABLET BY MOUTH AT  BEDTIME 90 tablet 3   vitamin E 1000 UNIT capsule Take 1,000 Units by mouth daily.     No facility-administered medications prior to visit.   Allergies  Allergen Reactions   Nsaids Rash     Objective:   Today's Vitals   10/16/21 1317  BP: 126/70  Pulse: 86  Temp: (!) 97.4 F (36.3 C)  TempSrc: Temporal  SpO2: 97%  Weight: 249 lb (112.9 kg)  Height: '5\' 2"'$  (1.575 m)   Body mass index is 45.54 kg/m.   General: Well developed, well nourished. No acute distress. Psych: Alert and oriented. Normal mood and affect.  Health Maintenance Due  Topic Date Due   TETANUS/TDAP  Never done   DEXA SCAN  Never done   Zoster Vaccines- Shingrix (2 of 2) 11/17/2016   COVID-19 Vaccine (6 - Pfizer series) 05/26/2021   INFLUENZA VACCINE  08/15/2021     Imaging: Carotid Ultrasound (08/02/2021) Summary:  Right Carotid: Evidence consistent with a total occlusion of the right ICA.  Left Carotid: Velocities in the left ICA are consistent with a 1-39% stenosis.  Vertebrals:  Bilateral vertebral arteries demonstrate antegrade flow.  Subclavians: Normal flow hemodynamics were seen in bilateral subclavian arteries.   Assessment & Plan:   1. Essential hypertension Blood pressure is at goal. Continue amlodipine 2.5 mg daily, losartan 100 mg daily, and metoprolol 100 mg bid.  2. Persistent atrial fibrillation (HCC) Rate is controlled. Continue metoprolol 100 mg bid and anticoagulation with Eliquis 5 mg bid.  3. Multiple thyroid nodules Due for annual follow up on thyroid nodules.  - US THYROID; Future  4. Stress incontinence Stable. Continue  oxybutynin 5 mg qid.  5. Carotid stenosis, asymptomatic, bilateral Focused currently on medical management. Plan to repeat ultrasound next summer.  6. Mixed hyperlipidemia Lipids are at goal. Continue simvastatin.  7. Need for influenza vaccination  - Flu Vaccine QUAD High Dose([facility])  Return in about 6 months (around 04/17/2022) for Reassessment.   Haydee Salter, MD

## 2021-10-26 ENCOUNTER — Other Ambulatory Visit: Payer: Self-pay | Admitting: Family Medicine

## 2021-10-26 DIAGNOSIS — I10 Essential (primary) hypertension: Secondary | ICD-10-CM

## 2021-10-27 ENCOUNTER — Ambulatory Visit: Payer: Medicare Other | Attending: Interventional Cardiology | Admitting: Interventional Cardiology

## 2021-10-27 ENCOUNTER — Encounter: Payer: Self-pay | Admitting: Interventional Cardiology

## 2021-10-27 VITALS — BP 140/80 | HR 52 | Ht 62.0 in | Wt 246.0 lb

## 2021-10-27 DIAGNOSIS — I10 Essential (primary) hypertension: Secondary | ICD-10-CM | POA: Diagnosis not present

## 2021-10-27 DIAGNOSIS — I6523 Occlusion and stenosis of bilateral carotid arteries: Secondary | ICD-10-CM

## 2021-10-27 DIAGNOSIS — I4819 Other persistent atrial fibrillation: Secondary | ICD-10-CM

## 2021-10-27 DIAGNOSIS — E782 Mixed hyperlipidemia: Secondary | ICD-10-CM

## 2021-10-27 NOTE — Patient Instructions (Signed)
Medication Instructions:  Your physician recommends that you continue on your current medications as directed. Please refer to the Current Medication list given to you today.  *If you need a refill on your cardiac medications before your next appointment, please call your pharmacy*   Lab Work: none If you have labs (blood work) drawn today and your tests are completely normal, you will receive your results only by: MyChart Message (if you have MyChart) OR A paper copy in the mail If you have any lab test that is abnormal or we need to change your treatment, we will call you to review the results.   Testing/Procedures: none   Follow-Up: At [facility], you and your health needs are our priority.  As part of our continuing mission to provide you with exceptional heart care, we have created designated Provider Care Teams.  These Care Teams include your primary Cardiologist (physician) and Advanced Practice Providers (APPs -  Physician Assistants and Nurse Practitioners) who all work together to provide you with the care you need, when you need it.  We recommend signing up for the patient portal called "MyChart".  Sign up information is provided on this After Visit Summary.  MyChart is used to connect with patients for Virtual Visits (Telemedicine).  Patients are able to view lab/test results, encounter notes, upcoming appointments, etc.  Non-urgent messages can be sent to your provider as well.   To learn more about what you can do with MyChart, go to [email or web address].    Your next appointment:   12 month(s)  The format for your next appointment:   In Person  Provider:   Jayadeep Varanasi, MD     Other Instructions    Important Information About Sugar       

## 2021-10-27 NOTE — Progress Notes (Signed)
Cardiology Office Note   Date:  10/27/2021   ID:  Kristin Lambert, DOB 1950/02/27, MRN [id number]  PCP:  Haydee Salter, MD    No chief complaint on file.  AFib   Wt Readings from Last 3 Encounters:  10/27/21 246 lb (111.6 kg)  10/16/21 249 lb (112.9 kg)  07/14/21 242 lb 6.4 oz (110 kg)       History of Present Illness: Kristin Lambert is a 71 y.o. female  with a h/o AFib.  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  Prior records show she: "has a history of hypertension, managed with amlodipine, losartan, and HCTZ. She is also on some occasional Lasix related to pedal edema. She recently had a right carpal tunnel release. She notes that during her preoperative exam, she was found to have atrial fibrillation. She was advised to follow-up with her PCP regarding this. She denies any new or worsening dyspnea or orthopnea. She has had some mild increase in lower leg edema that she relates to not being able to elevate her legs as much as usual since her surgery."   LDL 44 in 2022.    ECG in December 2022 on a preoperative ECG revealed atrial fibrillation with controlled ventricular response.   She still had surgery.  Outpatient surgery.  Started on Eliquis.  CHA2DS2-VASc score equals 4.  In 2/23: "Normal LV/RV function. Normal valvular function. Mildly dilated left atrium.  I think  it is reasonable to try cardioversion if she is having symptoms.  If she is not, could continue medical therapy/rate control and anticoagulation. "  Denies : Chest pain. Dizziness. Leg edema. Nitroglycerin use. Orthopnea. Paroxysmal nocturnal dyspnea. Shortness of breath. Syncope.    Rare palpitations.    No falls.  Leg weakness so she is careful.  Takes Eliquis regularly     Past Medical History:  Diagnosis Date   Arthritis    Carpal tunnel syndrome    Hypertension    Pulmonary embolus ([place])    "several years ago" , spontaneous pain in leg after excercise class. no other  symptoms, on ASA    Past Surgical History:  Procedure Laterality Date   ABDOMINAL HYSTERECTOMY     BIOPSY THYROID  05/2019   CARPAL TUNNEL RELEASE Right 12/21/2020   Procedure: RIGHT CARPAL TUNNEL RELEASE;  Surgeon: Sherilyn Cooter, MD;  Location: [facility];  Service: Orthopedics;  Laterality: Right;   CESAREAN SECTION     CHOLECYSTECTOMY     HAMMER TOE SURGERY Right    KNEE REPAIR EXTENSOR MECHANISM Right    ORIF ANKLE FRACTURE Left      Current Outpatient Medications  Medication Sig Dispense Refill   acetaminophen (TYLENOL) 500 MG tablet Take 650 mg by mouth every 6 (six) hours as needed.     amLODipine (NORVASC) 2.5 MG tablet TAKE 1 TABLET BY MOUTH DAILY 90 tablet 3   apixaban (ELIQUIS) 5 MG TABS tablet TAKE 1 TABLET BY MOUTH TWICE A DAY 60 tablet 6   Ascorbic Acid (VITAMIN C) 100 MG tablet Take 100 mg by mouth daily.     cholecalciferol (VITAMIN D3) 25 MCG (1000 UNIT) tablet Take 1,000 Units by mouth daily.     fluticasone (FLONASE) 50 MCG/ACT nasal spray USE 2 SPRAYS IN BOTH NOSTRILS  DAILY 48 g 2   furosemide (LASIX) 20 MG tablet Take 1 tablet (20 mg total) by mouth daily as needed. 30 tablet 0   hydrochlorothiazide (HYDRODIURIL) 25 MG  tablet TAKE 1 TABLET BY MOUTH DAILY 90 tablet 3   losartan (COZAAR) 100 MG tablet TAKE 1 TABLET BY MOUTH DAILY 90 tablet 3   metoprolol tartrate (LOPRESSOR) 100 MG tablet TAKE 1 TABLET BY MOUTH  TWICE DAILY 180 tablet 3   Multiple Vitamin (MULTIVITAMIN) tablet Take 1 tablet by mouth daily.     oxybutynin (DITROPAN) 5 MG tablet Take 1 tablet (5 mg total) by mouth 4 (four) times daily. 360 tablet 1   potassium chloride (KLOR-CON) 10 MEQ tablet TAKE 1 TABLET BY MOUTH DAILY 90 tablet 3   simvastatin (ZOCOR) 40 MG tablet TAKE 1 TABLET BY MOUTH AT  BEDTIME 90 tablet 3   vitamin E 1000 UNIT capsule Take 1,000 Units by mouth daily.     No current facility-administered medications for this visit.    Allergies:   Nsaids    Social  History:  The patient  reports that she has never smoked. She has never used smokeless tobacco. She reports that she does not drink alcohol and does not use drugs.   Family History:  The patient's family history includes Heart disease in her brother, mother, and sister; Heart failure in her brother; Hypertension in her brother and sister; Stroke in her mother and sister.    ROS:  Please see the history of present illness.   Otherwise, review of systems are positive for leg edema.   All other systems are reviewed and negative.    PHYSICAL EXAM: VS:  BP (!) 140/80   Pulse (!) 52   Ht '5\' 2"'$  (1.575 m)   Wt 246 lb (111.6 kg)   SpO2 97%   BMI 44.99 kg/m  , BMI Body mass index is 44.99 kg/m. GEN: Well nourished, well developed, in no acute distress HEENT: normal Neck: no JVD, carotid bruits, or masses Cardiac: \RRR; no murmurs, rubs, or gallops,no\\; L> R leg edema ; scar on left ankle from prior fracture Respiratory:  clear to auscultation bilaterally, normal work of breathing GI: soft, nontender, nondistended, + BS MS: no deformity or atrophy Skin: warm and dry, no rash Neuro:  Strength and sensation are intact; slow gait-uses cane Psych: euthymic mood, full affect   EKG:   The ekg ordered 1/23 demonstrates AFib, rate controlled   Recent Labs: 04/12/2021: ALT 9; BUN 25; Creatinine, Ser 0.87; Potassium 3.8; Sodium 140 07/14/2021: Hemoglobin 12.3; Platelets 235.0; TSH 1.30   Lipid Panel    Component Value Date/Time   CHOL 111 04/12/2021 0935   TRIG 124.0 04/12/2021 0935   HDL 42.20 04/12/2021 0935   CHOLHDL 3 04/12/2021 0935   VLDL 24.8 04/12/2021 0935   LDLCALC 44 04/12/2021 0935   LDLDIRECT 47.0 07/30/2019 1650     Other studies Reviewed: Additional studies/ records that were reviewed today with results demonstrating: 7/23 carotid ultrasound.  Normal Hbg and renal function in 2023.    ASSESSMENT AND PLAN:  Atrial fibrillation: She has been rate controlled with  metoprolol.   Acquired thrombophilia: Eliquis for stroke prevention in the setting of atrial fibrillation.  Not interested in cardioversion at this time given the lack of symptoms.  Hyperlipidemia: TC 111, HDL 42, LDL 44, TG 124 in 3/23. PAD: He reports that she has an occluded right carotid artery.  No intervention was needed due to adequate collateral circulation.  Increase activity to the target belowShe is cautious to avoid falling.       Current medicines are reviewed at length with the patient today.  The patient concerns  regarding her medicines were addressed.  The following changes have been made:  No change  Labs/ tests ordered today include: 1 year No orders of the defined types were placed in this encounter.   Recommend 150 minutes/week of aerobic exercise Low fat, low carb, high fiber diet recommended  Disposition:   FU in 1 year   Signed, Larae Grooms, MD  10/27/2021 4:39 PM    [facility] [place], [place], [place]  [address] Phone: [phone]; Fax: [phone]

## 2021-11-01 ENCOUNTER — Other Ambulatory Visit: Payer: Medicare Other

## 2021-11-09 ENCOUNTER — Other Ambulatory Visit: Payer: Self-pay | Admitting: Family Medicine

## 2021-11-09 DIAGNOSIS — I10 Essential (primary) hypertension: Secondary | ICD-10-CM

## 2021-11-14 DIAGNOSIS — B351 Tinea unguium: Secondary | ICD-10-CM | POA: Diagnosis not present

## 2021-11-14 DIAGNOSIS — L84 Corns and callosities: Secondary | ICD-10-CM | POA: Diagnosis not present

## 2021-11-20 IMAGING — US US THYROID
1 series · 12 of 25 positions shown · non-contrast
Comparison: None.

CLINICAL DATA: History of thyroid nodules

EXAM:
THYROID ULTRASOUND
TECHNIQUE: Ultrasound examination of the thyroid gland and adjacent soft
tissues was performed.

[Series 1: us thyroid · 0.07mm/px · 12 of 44 slices shown]
[im 2/44]
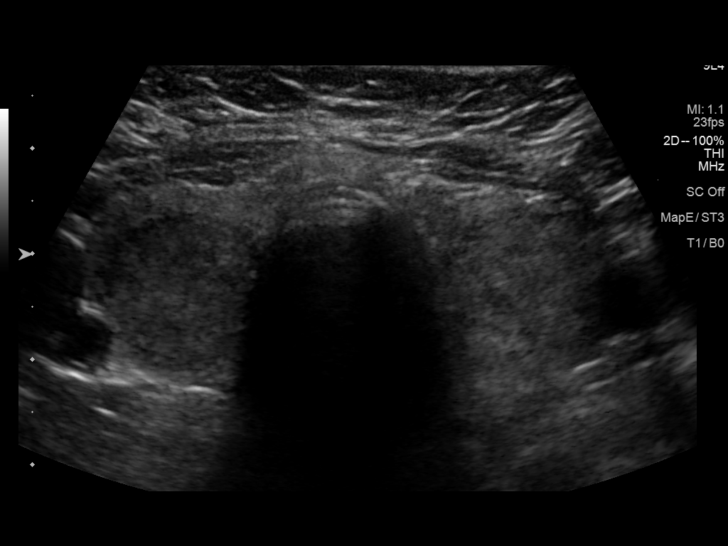
[im 6/44]
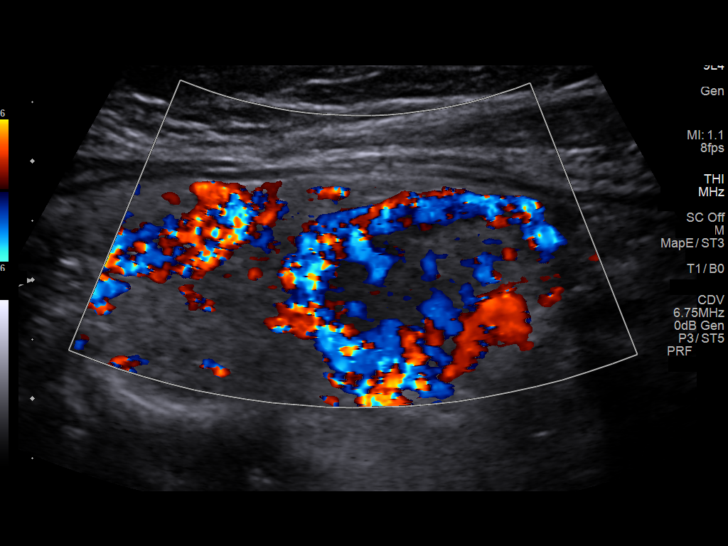
[im 9/44]
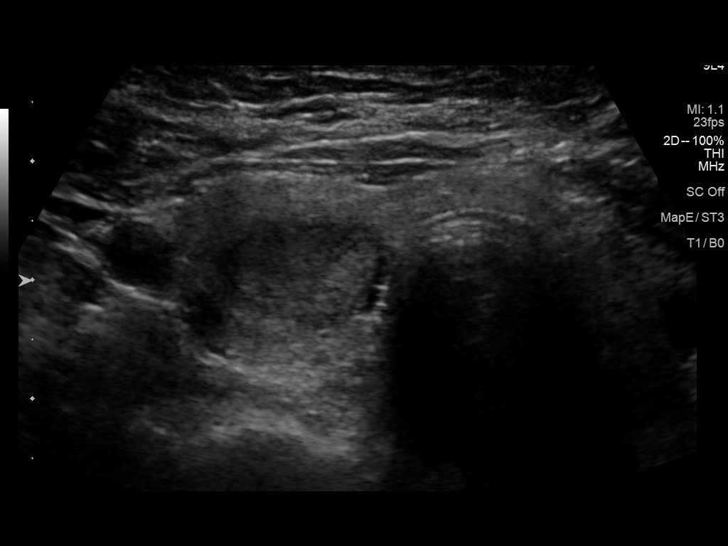
[im 13/44]
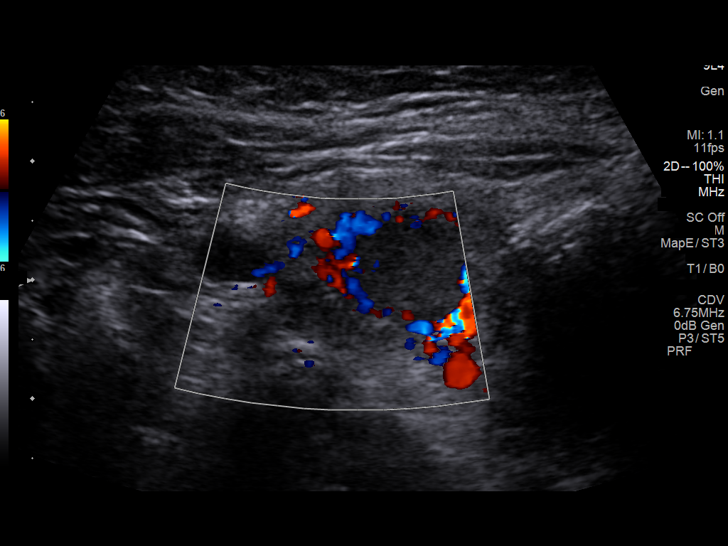
[im 17/44]
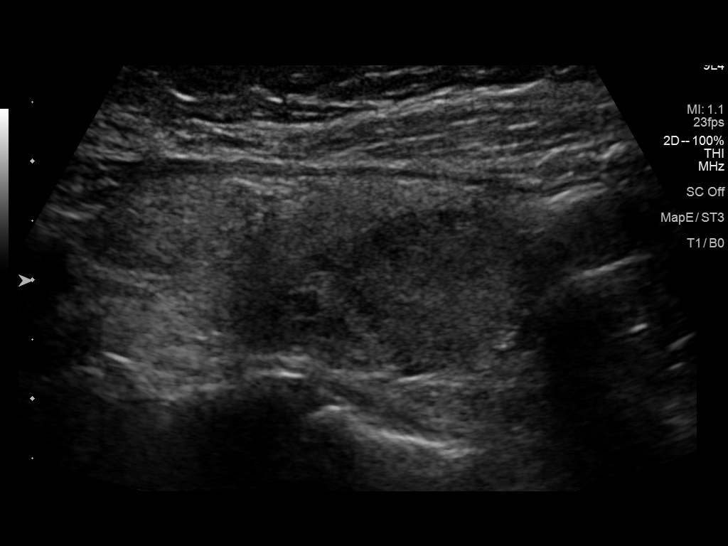
[im 20/44]
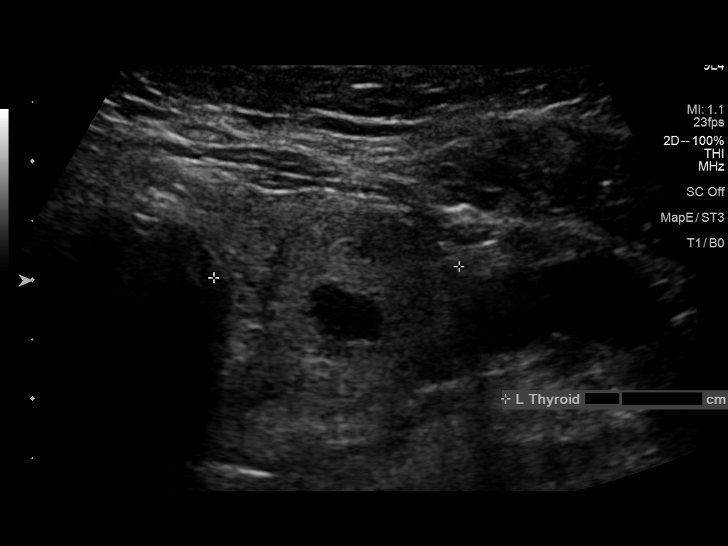
[im 24/44]
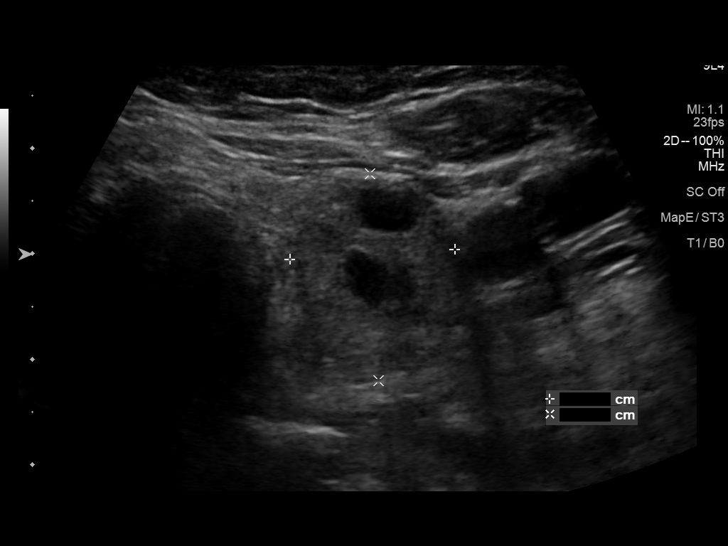
[im 27/44]
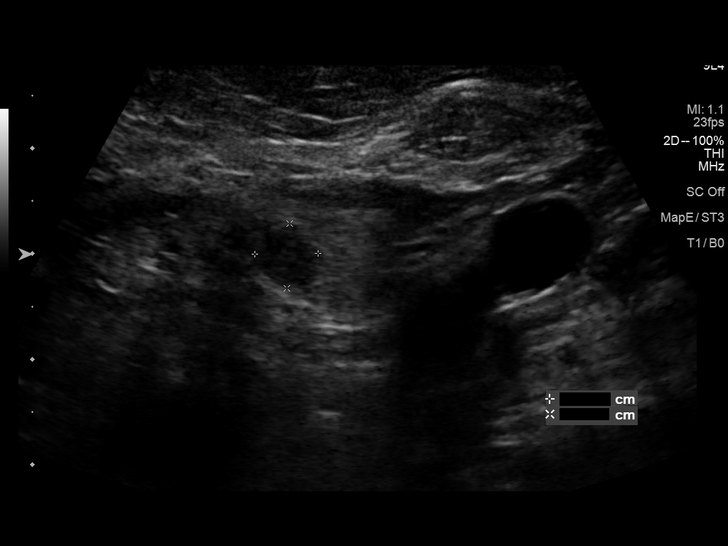
[im 31/44]
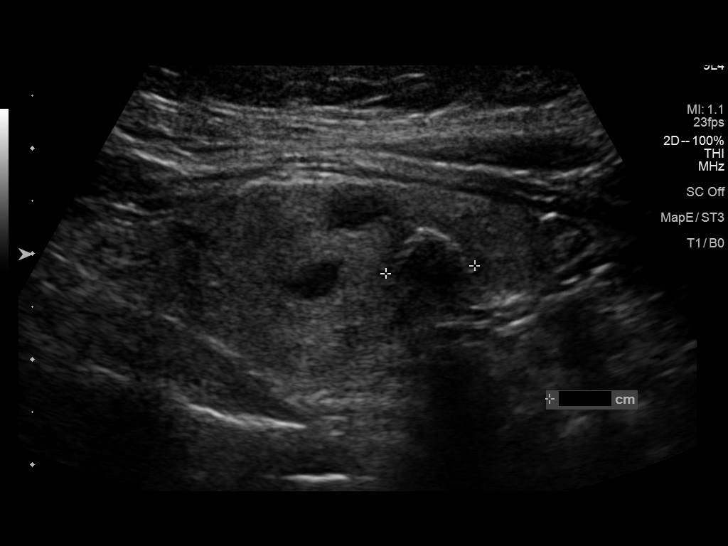
[im 35/44]
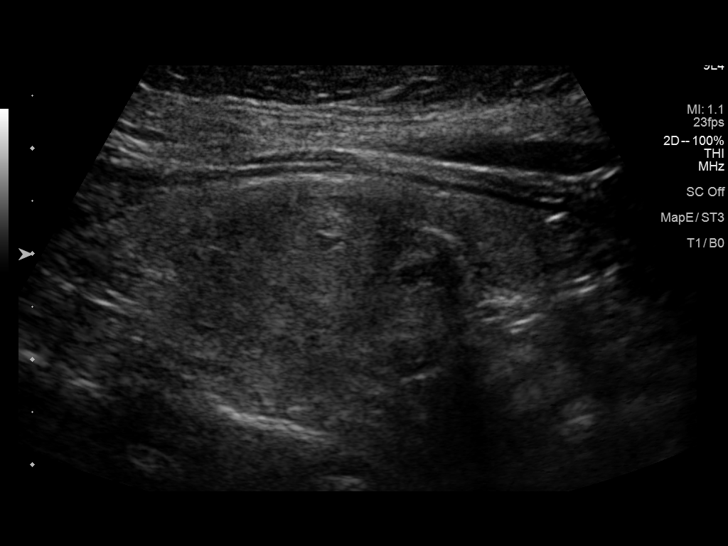
[im 38/44]
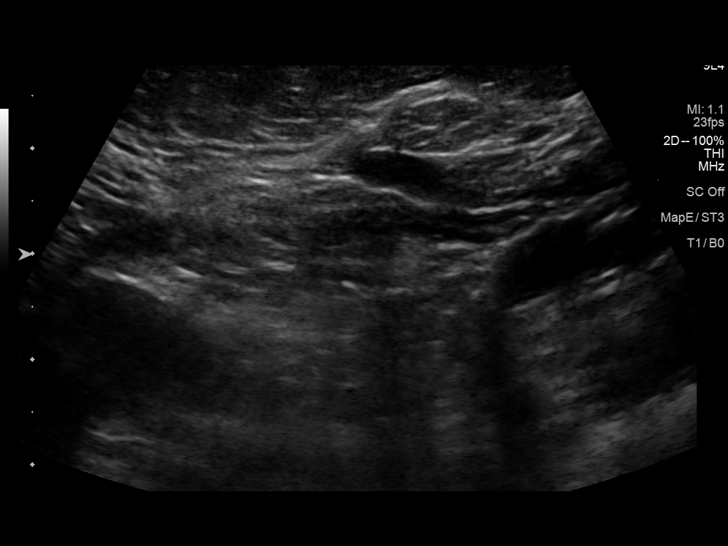
[im 42/44]
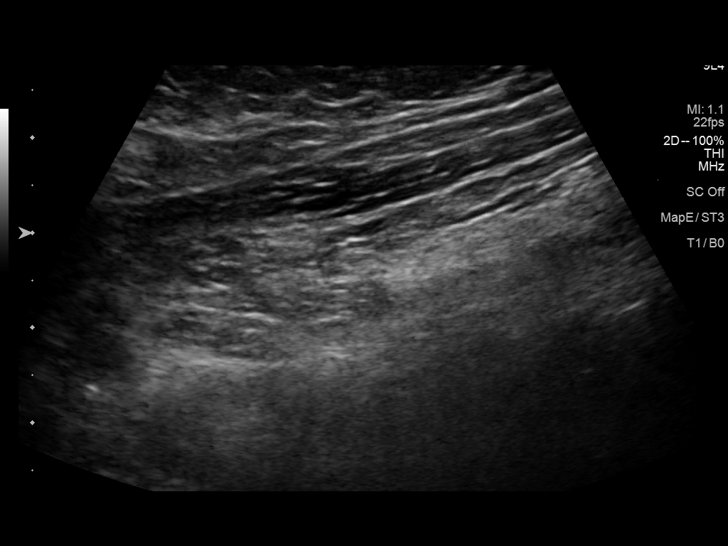

[12 of 25 positions shown; findings below may reference images not displayed]

FINDINGS: Parenchymal Echotexture: Mildly heterogeneous

Isthmus: 0.4 cm

Right lobe: 4.6 x 1.8 x 2.0 cm

Left lobe: 4.3 x 2.0 x 2.1 cm

_________________________________________________________

Estimated total number of nodules >/= 1 cm: 3

Number of spongiform nodules >/=  2 cm not described below (TR1): 0

Number of mixed cystic and solid nodules >/= 1.5 cm not described
below (TR2): 0

_________________________________________________________

Nodule 1: 0.8 x 0.5 x 0.5 cm hypoechoic solid nodule in the mid
right thyroid lobe does not meet criteria for imaging surveillance
or FNA.

_________________________________________________________

Nodule # 2:

Location: Right; inferior

Maximum size: 2.3 cm; Other 2 dimensions: 1.9 x 1.5 cm

Composition: solid/almost completely solid (2)

Echogenicity: hypoechoic (2)

Shape: not taller-than-wide (0)

Margins: smooth (0)

Echogenic foci: none (0)

ACR TI-RADS total points: 4.

ACR TI-RADS risk category: TR4 (4-6 points).

ACR TI-RADS recommendations:

**Given size (>/= 1.5 cm) and appearance, fine needle aspiration of
this moderately suspicious nodule should be considered based on
TI-RADS criteria.

_________________________________________________________

Nodule # 3:

Location: Left; mid

Maximum size: 2.2 cm; Other 2 dimensions: 2.0 x 1.6 cm

Composition: solid/almost completely solid (2)

Echogenicity: isoechoic (1)

Shape: taller-than-wide (3)

Margins: ill-defined (0)

Echogenic foci: none (0)

ACR TI-RADS total points: 6.

ACR TI-RADS risk category: TR4 (4-6 points).

ACR TI-RADS recommendations:

**Given size (>/= 1.5 cm) and appearance, fine needle aspiration of
this moderately suspicious nodule should be considered based on
TI-RADS criteria.

_________________________________________________________

Nodule # 4:

Location: Left; mid

Maximum size: 1.0 cm; Other 2 dimensions: 0.9 x 0.9 cm

Composition: cannot determine (2)

Echogenicity: cannot determine (1)

Shape: not taller-than-wide (0)

Margins: ill-defined (0)

Echogenic foci: peripheral calcifications (2)

ACR TI-RADS total points: 5.

ACR TI-RADS risk category: TR4 (4-6 points).

ACR TI-RADS recommendations:

*Given size (>/= 1 - 1.4 cm) and appearance, a follow-up ultrasound
in 1 year should be considered based on TI-RADS criteria.

_________________________________________________________

Nodule 5: 0.6 x 0.6 x 0.6 cm solid hypoechoic nodule in the inferior
left thyroid lobe does not meet criteria for imaging surveillance or
FNA.
IMPRESSION: 1. Nodules 2 and 3 meet criteria for FNA.
2. Nodule 4 meets criteria for imaging surveillance. Annual
ultrasound surveillance is recommended until 5 years of stability is
documented.

The above is in keeping with the ACR TI-RADS recommendations - [HOSPITAL] 8244;[DATE].

## 2021-12-12 ENCOUNTER — Other Ambulatory Visit: Payer: Medicare Other

## 2022-01-01 DIAGNOSIS — L820 Inflamed seborrheic keratosis: Secondary | ICD-10-CM | POA: Diagnosis not present

## 2022-01-01 DIAGNOSIS — D1723 Benign lipomatous neoplasm of skin and subcutaneous tissue of right leg: Secondary | ICD-10-CM | POA: Diagnosis not present

## 2022-01-01 DIAGNOSIS — D1801 Hemangioma of skin and subcutaneous tissue: Secondary | ICD-10-CM | POA: Diagnosis not present

## 2022-01-01 DIAGNOSIS — D485 Neoplasm of uncertain behavior of skin: Secondary | ICD-10-CM | POA: Diagnosis not present

## 2022-01-01 DIAGNOSIS — L821 Other seborrheic keratosis: Secondary | ICD-10-CM | POA: Diagnosis not present

## 2022-01-29 ENCOUNTER — Other Ambulatory Visit: Payer: Self-pay | Admitting: Family Medicine

## 2022-01-29 DIAGNOSIS — N393 Stress incontinence (female) (male): Secondary | ICD-10-CM

## 2022-02-01 ENCOUNTER — Other Ambulatory Visit: Payer: Self-pay | Admitting: Interventional Cardiology

## 2022-02-01 DIAGNOSIS — I4819 Other persistent atrial fibrillation: Secondary | ICD-10-CM

## 2022-02-01 NOTE — Telephone Encounter (Signed)
Prescription refill request for Eliquis received. Indication: Afib  Last office visit: 10/27/21 [place])  Scr: 0.87 (04/12/21)  Age: 72 Weight: 111.6kg  Appropriate dose and refill sent to requested pharmacy.

## 2022-02-06 ENCOUNTER — Ambulatory Visit
Admission: RE | Admit: 2022-02-06 | Discharge: 2022-02-06 | Disposition: A | Discharge status: Home / Self Care | Payer: Medicare Other | Source: Ambulatory Visit | Attending: Family Medicine | Admitting: Family Medicine

## 2022-02-06 DIAGNOSIS — E041 Nontoxic single thyroid nodule: Secondary | ICD-10-CM | POA: Diagnosis not present

## 2022-02-06 DIAGNOSIS — E042 Nontoxic multinodular goiter: Secondary | ICD-10-CM

## 2022-02-15 ENCOUNTER — Other Ambulatory Visit: Payer: Self-pay

## 2022-02-15 DIAGNOSIS — E782 Mixed hyperlipidemia: Secondary | ICD-10-CM

## 2022-02-15 DIAGNOSIS — I4819 Other persistent atrial fibrillation: Secondary | ICD-10-CM

## 2022-02-15 DIAGNOSIS — I10 Essential (primary) hypertension: Secondary | ICD-10-CM

## 2022-02-16 NOTE — Addendum Note (Signed)
Addended by: Konrad Saha on: 02/16/2022 02:35 PM   Modules accepted: Orders

## 2022-03-05 DIAGNOSIS — M19072 Primary osteoarthritis, left ankle and foot: Secondary | ICD-10-CM | POA: Diagnosis not present

## 2022-03-05 DIAGNOSIS — L84 Corns and callosities: Secondary | ICD-10-CM | POA: Diagnosis not present

## 2022-03-05 DIAGNOSIS — B351 Tinea unguium: Secondary | ICD-10-CM | POA: Diagnosis not present

## 2022-03-09 ENCOUNTER — Telehealth: Payer: Self-pay

## 2022-03-09 NOTE — Patient Instructions (Signed)
Visit Information  Thank you for taking time to visit with me today. Please don't hesitate to contact me if I can be of assistance to you.   Following are the goals we discussed today:   Goals Addressed             This Visit's Progress    COMPLETED: Care Coordination Activities-No follow up required       Interventions Today    Flowsheet Row Most Recent Value  Chronic Disease   Chronic disease during today's visit Hypertension (HTN)  General Interventions   General Interventions Discussed/Reviewed General Interventions Discussed, Doctor Visits, Health Screening  Doctor Visits Discussed/Reviewed Doctor Visits Discussed, Annual Wellness Visits  [patient to call to schedule due 3/24]  Health Screening Mammogram  [encouraged to schedule.]  Education Interventions   Education Provided Provided Education  Provided Verbal Education On Nutrition, Medication  Nutrition Interventions   Nutrition Discussed/Reviewed Nutrition Discussed, Decreasing salt  Pharmacy Interventions   Pharmacy Dicussed/Reviewed Medications and their functions      Reviewed medications.  Patient reports taking medications as prescribed.  Reiterated medication adherence.            If you are experiencing a Mental Health or [place] or need someone to talk to, please call the Suicide and Crisis Lifeline: 988   Patient verbalizes understanding of instructions and care plan provided today and agrees to view in [place]. Active MyChart status and patient understanding of how to access instructions and care plan via MyChart confirmed with patient.     No further follow up required: decline  Jone Baseman, RN, MSN [place] [facility] Care Management Coordinator Direct Line [phone]

## 2022-03-09 NOTE — Patient Outreach (Signed)
  Care Coordination   Initial Visit Note   03/09/2022 Name: Jolin Kain MRN: [id number] DOB: May 15, 1950  Jullie Deshotels is a 72 y.o. year old female who sees Rudd, Lillette Boxer, MD for primary care. I spoke with  Loni Beckwith by phone today. Patient is doing well.  Primary caretaker to spouse who has alzheimer's and is on Hospice currently.  What matters to the patients health and wellness today?  none    Goals Addressed             This Visit's Progress    COMPLETED: Care Coordination Activities-No follow up required       Interventions Today    Flowsheet Row Most Recent Value  Chronic Disease   Chronic disease during today's visit Hypertension (HTN)  General Interventions   General Interventions Discussed/Reviewed General Interventions Discussed  Education Interventions   Education Provided Provided Education  Provided Verbal Education On Nutrition, Medication  Nutrition Interventions   Nutrition Discussed/Reviewed Nutrition Discussed, Decreasing salt  Pharmacy Interventions   Pharmacy Dicussed/Reviewed Medications and their functions      Reviewed medications.  Patient reports taking medications as prescribed.  Reiterated medication adherence.          SDOH assessments and interventions completed:  Yes  SDOH Interventions Today    Flowsheet Row Most Recent Value  SDOH Interventions   Food Insecurity Interventions Intervention Not Indicated  Housing Interventions Intervention Not Indicated        Care Coordination Interventions:  Yes, provided   Follow up plan: No further intervention required.   Encounter Outcome:  Pt. Visit Completed   Jone Baseman, RN, MSN [place] [facility] Care Management Coordinator Direct Line [phone]

## 2022-03-20 ENCOUNTER — Other Ambulatory Visit: Payer: Self-pay | Admitting: Family Medicine

## 2022-03-20 ENCOUNTER — Encounter: Payer: Self-pay | Admitting: Family Medicine

## 2022-03-20 DIAGNOSIS — Z13820 Encounter for screening for osteoporosis: Secondary | ICD-10-CM

## 2022-03-20 DIAGNOSIS — N951 Menopausal and female climacteric states: Secondary | ICD-10-CM

## 2022-03-20 DIAGNOSIS — Z1231 Encounter for screening mammogram for malignant neoplasm of breast: Secondary | ICD-10-CM

## 2022-03-30 ENCOUNTER — Other Ambulatory Visit: Payer: Self-pay | Admitting: Family Medicine

## 2022-04-05 ENCOUNTER — Other Ambulatory Visit: Payer: Medicare Other

## 2022-04-12 ENCOUNTER — Telehealth: Payer: Self-pay | Admitting: Family Medicine

## 2022-04-12 NOTE — Telephone Encounter (Signed)
Called patient to schedule Medicare Annual Wellness Visit (AWV). No voicemail available to leave a message.   No answer  Last date of AWV: 04/04/21  Please schedule an appointment at any time with [facility].  If any questions, please contact me at [phone].  Thank you ,  Kristin Lambert AWV direct phone # [phone]

## 2022-04-17 ENCOUNTER — Ambulatory Visit: Payer: Medicare Other | Admitting: Family Medicine

## 2022-04-25 ENCOUNTER — Other Ambulatory Visit: Payer: Self-pay | Admitting: Family Medicine

## 2022-06-06 ENCOUNTER — Ambulatory Visit (INDEPENDENT_AMBULATORY_CARE_PROVIDER_SITE_OTHER): Payer: Medicare Other | Admitting: Family Medicine

## 2022-06-06 ENCOUNTER — Encounter: Payer: Self-pay | Admitting: Family Medicine

## 2022-06-06 VITALS — BP 120/74 | HR 64 | Temp 98.8°F | Ht 62.0 in | Wt 254.4 lb

## 2022-06-06 DIAGNOSIS — E042 Nontoxic multinodular goiter: Secondary | ICD-10-CM

## 2022-06-06 DIAGNOSIS — I10 Essential (primary) hypertension: Secondary | ICD-10-CM

## 2022-06-06 DIAGNOSIS — I4819 Other persistent atrial fibrillation: Secondary | ICD-10-CM | POA: Diagnosis not present

## 2022-06-06 DIAGNOSIS — R600 Localized edema: Secondary | ICD-10-CM

## 2022-06-06 DIAGNOSIS — N3946 Mixed incontinence: Secondary | ICD-10-CM | POA: Diagnosis not present

## 2022-06-06 MED ORDER — FUROSEMIDE 20 MG PO TABS: 20.0000 mg | ORAL_TABLET | Freq: Every day | ORAL | 3 refills | Status: DC | PRN

## 2022-06-06 MED ORDER — TROSPIUM CHLORIDE 20 MG PO TABS
20.0000 mg | ORAL_TABLET | Freq: Two times a day (BID) | ORAL | 3 refills | Status: DC
Start: 2022-06-06 — End: 2022-12-10

## 2022-06-06 NOTE — Assessment & Plan Note (Signed)
Thyroid nodules have been gradually progressive. Prior biopsies of both nodules showed benign tissue. Plan to continue annual assessment.

## 2022-06-06 NOTE — Assessment & Plan Note (Signed)
Blood pressure is in good control. Continue amlodipine 2.5 mg daily, losartan 100 mg daily, and HCTZ 25 mg daily.

## 2022-06-06 NOTE — Progress Notes (Signed)
[facility] [address] [place] [address] Dept: [phone] Dept Fax: [phone]  Chronic Care Office Visit  Subjective:    Patient ID: Kristin Lambert, female    DOB: 11/17/1950, 72 y.o..   MRN: [id number]  Chief Complaint  Patient presents with   Medical Management of Chronic Issues    3 month f/u.     History of Present Illness:  Patient is in today for reassessment of chronic medical issues.  Kristin Lambert has a history of hypertension, managed with amlodipine 2.5 mg daily, losartan 100 mg daily, and HCTZ 25 mg daily. She is also on some occasional Lasix related to pedal edema and takes a potassium supplement. She has been out of the Lasix for a while and notes her swelling is worse. She wonders if she should just take this daily.   Kristin Lambert has a history of atrial fibrillation. She is followed by Dr. Eldridge Dace (cardiology). Currently, her rate has been well controlled. Kristin Lambert is on apixiban (Eliquis) 5 mg twice daily for anticoagulation.   Kristin Lambert has a history of hyperlipidemia and is managed on simvastatin 40 mg dialy.    Kristin Lambert husband has gradually worsening issues with dementia and with mobility, under Hospice care. She does some have some in-home assistance and her daughter helps. She took time to discuss the challenges she has in providing his care.   Kristin Lambert has a history of mixed incontinence. She is managed on oxybutynin 5 mg qid. She tries to restrict her fluids in the evening, but still has nights where she has to get up and sometimes leaks before she can.  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  arthritis 10/18/2020   Multiple thyroid nodules 10/18/2020   Morbid obesity with BMI of 45.0-49.9, adult (HCC) 11/13/2019   Vitamin D deficiency 07/30/2019   Hyperlipidemia 07/30/2019   Osteoarthritis of knees, bilateral 06/19/2019   Essential hypertension 06/19/2019   Past Surgical History:  Procedure Laterality Date   ABDOMINAL HYSTERECTOMY     BIOPSY THYROID  05/2019   CARPAL TUNNEL RELEASE Right 12/21/2020   Procedure: RIGHT CARPAL TUNNEL RELEASE;  Surgeon: Marlyne Beards, MD;  Location: [facility];  Service: Orthopedics;  Laterality: Right;   CESAREAN SECTION     CHOLECYSTECTOMY     HAMMER TOE SURGERY Right    KNEE REPAIR EXTENSOR MECHANISM Right    ORIF ANKLE FRACTURE Left    Family History  Problem Relation Age of Onset   Stroke Mother    Heart disease Mother    Heart disease Sister    Hypertension Sister    Stroke Sister    Heart disease Brother    Hypertension Brother    Heart failure Brother    Breast cancer Neg Hx    Outpatient Medications Prior to Visit  Medication Sig Dispense Refill   acetaminophen (TYLENOL) 500 MG tablet Take 650 mg by mouth every 6 (six) hours as needed.     amLODipine (NORVASC) 2.5 MG tablet TAKE 1 TABLET BY MOUTH DAILY 90 tablet 1   apixaban (ELIQUIS) 5 MG TABS tablet TAKE 1 TABLET BY MOUTH TWICE A DAY 60 tablet 5   cholecalciferol (  VITAMIN D3) 25 MCG (1000 UNIT) tablet Take 1,000 Units by mouth daily.     fluticasone (FLONASE) 50 MCG/ACT nasal spray USE 2 SPRAYS IN BOTH NOSTRILS  DAILY 48 g 2   hydrochlorothiazide (HYDRODIURIL) 25 MG tablet TAKE 1 TABLET BY MOUTH DAILY 100 tablet 2   losartan (COZAAR) 100 MG tablet TAKE 1 TABLET BY MOUTH DAILY 90 tablet 3   metoprolol tartrate (LOPRESSOR) 100 MG tablet TAKE 1 TABLET BY MOUTH TWICE  DAILY 180 tablet 1   Multiple Vitamin (MULTIVITAMIN) tablet Take 1 tablet by mouth daily.     oxybutynin (DITROPAN) 5 MG tablet TAKE 1 TABLET BY MOUTH 4 TIMES  DAILY 360 tablet 3   potassium  chloride (KLOR-CON) 10 MEQ tablet TAKE 1 TABLET BY MOUTH DAILY 90 tablet 1   simvastatin (ZOCOR) 40 MG tablet TAKE 1 TABLET BY MOUTH AT  BEDTIME 90 tablet 3   vitamin E 1000 UNIT capsule Take 1,000 Units by mouth daily.     furosemide (LASIX) 20 MG tablet Take 1 tablet (20 mg total) by mouth daily as needed. 30 tablet 0   Ascorbic Acid (VITAMIN C) 100 MG tablet Take 100 mg by mouth daily. (Patient not taking: Reported on 06/06/2022)     No facility-administered medications prior to visit.   Allergies  Allergen Reactions   Nsaids Rash   Objective:   Today's Vitals   06/06/22 1501  BP: 120/74  Pulse: 64  Temp: 98.8 F (37.1 C)  TempSrc: Temporal  SpO2: 96%  Weight: 254 lb 6.4 oz (115.4 kg)  Height: 5\' 2"  (1.575 m)   Body mass index is 46.53 kg/m.   General: Well developed, well nourished. No acute distress. CV: RRR without murmurs or rubs. Extremities: 3+ edema noted. Psych: Alert and oriented. Normal mood and affect.  Health Maintenance Due  Topic Date Due   DTaP/Tdap/Td (1 - Tdap) Never done   DEXA SCAN  Never done   Zoster Vaccines- Shingrix (2 of 2) 11/17/2016   MAMMOGRAM  11/02/2021   Medicare Annual Wellness (AWV)  04/05/2022   Imaging: Thyroid US (02/06/2022) IMPRESSION: 1. Slight interval enlargement of TI-RADS category 4 nodule (labeled # 3) in the left mid gland. As before, this lesion meets criteria to consider fine-needle aspiration biopsy if not previously performed. 2. No interval change in the size or appearance of the TI-RADS category 4 nodule (labeled # 2) in the right inferior gland. As before, this lesion meets criteria to consider fine-needle aspiration biopsy if not previously performed. 3. Nodules # 1 and # 4 both meet criteria for continued imaging surveillance. Recommend follow-up ultrasound in 1 year.    Assessment & Plan:   Problem List Items Addressed This Visit       Cardiovascular and Mediastinum   Essential hypertension    Blood  pressure is in good control. Continue amlodipine 2.5 mg daily, losartan 100 mg daily, and HCTZ 25 mg daily.      Relevant Medications   furosemide (LASIX) 20 MG tablet   Other Relevant Orders   Basic metabolic panel   Atrial fibrillation (HCC) - Primary    Appears to be in sinus rhythm at present. Rate controlled. Continue apixiban (Eliquis) 5 mg twice daily.      Relevant Medications   furosemide (LASIX) 20 MG tablet     Endocrine   Multiple thyroid nodules    Thyroid nodules have been gradually progressive. Prior biopsies of both nodules showed benign tissue. Plan to continue annual assessment.  Other   Pedal edema    Her weight is up, though I suspect much of this is fluid. I will renew her furosemide and do agree that she should try to take this daily.      Relevant Medications   furosemide (LASIX) 20 MG tablet   Mixed incontinence    We will try substituting trospium to see if she has better control of her bladder. The twice a day regimen would also be easier.      Relevant Medications   trospium (SANCTURA) 20 MG tablet   Other Relevant Orders   Urinalysis, Routine w reflex microscopic    Return in about 4 months (around 10/07/2022) for Reassessment.   Loyola Mast, MD

## 2022-06-06 NOTE — Assessment & Plan Note (Signed)
Appears to be in sinus rhythm at present. Rate controlled. Continue apixiban (Eliquis) 5 mg twice daily.

## 2022-06-06 NOTE — Assessment & Plan Note (Signed)
We will try substituting trospium to see if she has better control of her bladder. The twice a day regimen would also be easier.

## 2022-06-06 NOTE — Assessment & Plan Note (Signed)
Her weight is up, though I suspect much of this is fluid. I will renew her furosemide and do agree that she should try to take this daily.

## 2022-06-07 LAB — URINALYSIS, ROUTINE W REFLEX MICROSCOPIC
Bilirubin Urine: NEGATIVE
Ketones, ur: NEGATIVE
Nitrite: NEGATIVE
Specific Gravity, Urine: 1.025 (ref 1.000–1.030)
Total Protein, Urine: NEGATIVE
Urine Glucose: NEGATIVE
Urobilinogen, UA: 0.2 (ref 0.0–1.0)
pH: 5.5 (ref 5.0–8.0)

## 2022-06-07 LAB — BASIC METABOLIC PANEL
BUN: 25 mg/dL — ABNORMAL HIGH (ref 6–23)
CO2: 24 mEq/L (ref 19–32)
Calcium: 9.5 mg/dL (ref 8.4–10.5)
Chloride: 105 mEq/L (ref 96–112)
Creatinine, Ser: 1.02 mg/dL (ref 0.40–1.20)
GFR: 54.88 mL/min — ABNORMAL LOW (ref >60.00)
Glucose, Bld: 102 mg/dL — ABNORMAL HIGH (ref 70–99)
Potassium: 4.1 mEq/L (ref 3.5–5.1)
Sodium: 139 mEq/L (ref 135–145)

## 2022-06-11 ENCOUNTER — Other Ambulatory Visit: Payer: Self-pay | Admitting: Family Medicine

## 2022-06-13 ENCOUNTER — Other Ambulatory Visit: Payer: Self-pay | Admitting: Family Medicine

## 2022-06-14 ENCOUNTER — Telehealth: Payer: Self-pay | Admitting: Family Medicine

## 2022-06-14 DIAGNOSIS — R600 Localized edema: Secondary | ICD-10-CM

## 2022-06-14 MED ORDER — FUROSEMIDE 20 MG PO TABS
20.0000 mg | ORAL_TABLET | Freq: Every day | ORAL | 3 refills | Status: DC | PRN
Start: 2022-06-14 — End: 2022-12-10

## 2022-06-14 NOTE — Telephone Encounter (Signed)
Pt is requesting that her meds especially this time her furosemide (LASIX) 20 MG tablet [[id number]] She is asking for Angelique Blonder to send to [facility] not [facility].

## 2022-06-14 NOTE — Telephone Encounter (Signed)
Rx sent to mail order and patient notified VIA phone  (left message).  Dm/cma

## 2022-06-15 ENCOUNTER — Telehealth: Payer: Self-pay | Admitting: Family Medicine

## 2022-06-15 NOTE — Telephone Encounter (Signed)
Spoke to patient, she did want it to go to the mail order.  Dm/cma

## 2022-06-15 NOTE — Telephone Encounter (Signed)
[facility] 20 MG tablet [[id number]]  and they never got the order and [facility] [address] [facility] [place]

## 2022-06-21 NOTE — Telephone Encounter (Signed)
error 

## 2022-06-21 NOTE — Telephone Encounter (Signed)
ERROR

## 2022-06-28 DIAGNOSIS — B351 Tinea unguium: Secondary | ICD-10-CM | POA: Diagnosis not present

## 2022-06-28 DIAGNOSIS — M79675 Pain in left toe(s): Secondary | ICD-10-CM | POA: Diagnosis not present

## 2022-06-28 DIAGNOSIS — L84 Corns and callosities: Secondary | ICD-10-CM | POA: Diagnosis not present

## 2022-07-05 ENCOUNTER — Ambulatory Visit: Payer: Medicare Other

## 2022-07-10 ENCOUNTER — Other Ambulatory Visit: Payer: Self-pay | Admitting: Family Medicine

## 2022-07-10 DIAGNOSIS — I10 Essential (primary) hypertension: Secondary | ICD-10-CM

## 2022-07-13 ENCOUNTER — Other Ambulatory Visit: Payer: Self-pay | Admitting: Family Medicine

## 2022-07-13 DIAGNOSIS — J302 Other seasonal allergic rhinitis: Secondary | ICD-10-CM

## 2022-07-20 ENCOUNTER — Ambulatory Visit (INDEPENDENT_AMBULATORY_CARE_PROVIDER_SITE_OTHER): Payer: Medicare Other

## 2022-07-20 VITALS — Ht 62.0 in | Wt 251.0 lb

## 2022-07-20 DIAGNOSIS — Z0000 Encounter for general adult medical examination without abnormal findings: Secondary | ICD-10-CM | POA: Diagnosis not present

## 2022-07-20 NOTE — Patient Instructions (Signed)
Kristin Lambert , Thank you for taking time to come for your Medicare Wellness Visit. I appreciate your ongoing commitment to your health goals. Please review the following plan we discussed and let me know if I can assist you in the future.   These are the goals we discussed:  Goals      Patient Stated     Eat a healthy diet & maintain current health     Patient Stated     07/20/2022, watch what she eats        This is a list of the screening recommended for you and due dates:  Health Maintenance  Topic Date Due   DTaP/Tdap/Td vaccine (1 - Tdap) Never done   DEXA scan (bone density measurement)  Never done   Zoster (Shingles) Vaccine (2 of 2) 11/17/2016   COVID-19 Vaccine (6 - 2023-24 season) 09/15/2021   Mammogram  11/02/2021   Flu Shot  08/16/2022   Medicare Annual Wellness Visit  07/20/2023   Colon Cancer Screening  12/14/2029   Hepatitis C Screening  Completed   HPV Vaccine  Aged Out   Pneumonia Vaccine  Discontinued    Advanced directives: Please bring a copy of your POA (Power of [place]) and/or Living Will to your next appointment.   Conditions/risks identified: none  Next appointment: Follow up in one year for your annual wellness visit    Preventive Care 65 Years and Older, Female Preventive care refers to lifestyle choices and visits with your health care provider that can promote health and wellness. What does preventive care include? A yearly physical exam. This is also called an annual well check. Dental exams once or twice a year. Routine eye exams. Ask your health care provider how often you should have your eyes checked. Personal lifestyle choices, including: Daily care of your teeth and gums. Regular physical activity. Eating a healthy diet. Avoiding tobacco and drug use. Limiting alcohol use. Practicing safe sex. Taking low-dose aspirin every day. Taking vitamin and mineral supplements as recommended by your health care provider. What happens during an  annual well check? The services and screenings done by your health care provider during your annual well check will depend on your age, overall health, lifestyle risk factors, and family history of disease. Counseling  Your health care provider may ask you questions about your: Alcohol use. Tobacco use. Drug use. Emotional well-being. Home and relationship well-being. Sexual activity. Eating habits. History of falls. Memory and ability to understand (cognition). Work and work Astronomer. Reproductive health. Screening  You may have the following tests or measurements: Height, weight, and BMI. Blood pressure. Lipid and cholesterol levels. These may be checked every 5 years, or more frequently if you are over 68 years old. Skin check. Lung cancer screening. You may have this screening every year starting at age 58 if you have a 30-pack-year history of smoking and currently smoke or have quit within the past 15 years. Fecal occult blood test (FOBT) of the stool. You may have this test every year starting at age 81. Flexible sigmoidoscopy or colonoscopy. You may have a sigmoidoscopy every 5 years or a colonoscopy every 10 years starting at age 90. Hepatitis C blood test. Hepatitis B blood test. Sexually transmitted disease (STD) testing. Diabetes screening. This is done by checking your blood sugar (glucose) after you have not eaten for a while (fasting). You may have this done every 1-3 years. Bone density scan. This is done to screen for osteoporosis. You may have  this done starting at age 110. Mammogram. This may be done every 1-2 years. Talk to your health care provider about how often you should have regular mammograms. Talk with your health care provider about your test results, treatment options, and if necessary, the need for more tests. Vaccines  Your health care provider may recommend certain vaccines, such as: Influenza vaccine. This is recommended every year. Tetanus,  diphtheria, and acellular pertussis (Tdap, Td) vaccine. You may need a Td booster every 10 years. Zoster vaccine. You may need this after age 31. Pneumococcal 13-valent conjugate (PCV13) vaccine. One dose is recommended after age 33. Pneumococcal polysaccharide (PPSV23) vaccine. One dose is recommended after age 73. Talk to your health care provider about which screenings and vaccines you need and how often you need them. This information is not intended to replace advice given to you by your health care provider. Make sure you discuss any questions you have with your health care provider. Document Released: 01/28/2015 Document Revised: 09/21/2015 Document Reviewed: 11/02/2014 Elsevier Interactive Patient Education  2017 [employer].  Fall Prevention in the Home Falls can cause injuries. They can happen to people of all ages. There are many things you can do to make your home safe and to help prevent falls. What can I do on the outside of my home? Regularly fix the edges of walkways and driveways and fix any cracks. Remove anything that might make you trip as you walk through a door, such as a raised step or threshold. Trim any bushes or trees on the path to your home. Use bright outdoor lighting. Clear any walking paths of anything that might make someone trip, such as rocks or tools. Regularly check to see if handrails are loose or broken. Make sure that both sides of any steps have handrails. Any raised decks and porches should have guardrails on the edges. Have any leaves, snow, or ice cleared regularly. Use sand or salt on walking paths during winter. Clean up any spills in your garage right away. This includes oil or grease spills. What can I do in the bathroom? Use night lights. Install grab bars by the toilet and in the tub and shower. Do not use towel bars as grab bars. Use non-skid mats or decals in the tub or shower. If you need to sit down in the shower, use a plastic,  non-slip stool. Keep the floor dry. Clean up any water that spills on the floor as soon as it happens. Remove soap buildup in the tub or shower regularly. Attach bath mats securely with double-sided non-slip rug tape. Do not have throw rugs and other things on the floor that can make you trip. What can I do in the bedroom? Use night lights. Make sure that you have a light by your bed that is easy to reach. Do not use any sheets or blankets that are too big for your bed. They should not hang down onto the floor. Have a firm chair that has side arms. You can use this for support while you get dressed. Do not have throw rugs and other things on the floor that can make you trip. What can I do in the kitchen? Clean up any spills right away. Avoid walking on wet floors. Keep items that you use a lot in easy-to-reach places. If you need to reach something above you, use a strong step stool that has a grab bar. Keep electrical cords out of the way. Do not use floor polish or  wax that makes floors slippery. If you must use wax, use non-skid floor wax. Do not have throw rugs and other things on the floor that can make you trip. What can I do with my stairs? Do not leave any items on the stairs. Make sure that there are handrails on both sides of the stairs and use them. Fix handrails that are broken or loose. Make sure that handrails are as long as the stairways. Check any carpeting to make sure that it is firmly attached to the stairs. Fix any carpet that is loose or worn. Avoid having throw rugs at the top or bottom of the stairs. If you do have throw rugs, attach them to the floor with carpet tape. Make sure that you have a light switch at the top of the stairs and the bottom of the stairs. If you do not have them, ask someone to add them for you. What else can I do to help prevent falls? Wear shoes that: Do not have high heels. Have rubber bottoms. Are comfortable and fit you well. Are closed  at the toe. Do not wear sandals. If you use a stepladder: Make sure that it is fully opened. Do not climb a closed stepladder. Make sure that both sides of the stepladder are locked into place. Ask someone to hold it for you, if possible. Clearly mark and make sure that you can see: Any grab bars or handrails. First and last steps. Where the edge of each step is. Use tools that help you move around (mobility aids) if they are needed. These include: Canes. Walkers. Scooters. Crutches. Turn on the lights when you go into a dark area. Replace any light bulbs as soon as they burn out. Set up your furniture so you have a clear path. Avoid moving your furniture around. If any of your floors are uneven, fix them. If there are any pets around you, be aware of where they are. Review your medicines with your doctor. Some medicines can make you feel dizzy. This can increase your chance of falling. Ask your doctor what other things that you can do to help prevent falls. This information is not intended to replace advice given to you by your health care provider. Make sure you discuss any questions you have with your health care provider. Document Released: 10/28/2008 Document Revised: 06/09/2015 Document Reviewed: 02/05/2014 Elsevier Interactive Patient Education  2017 [employer].

## 2022-07-20 NOTE — Progress Notes (Signed)
Subjective:   Kristin Lambert is a 72 y.o. female who presents for Medicare Annual (Subsequent) preventive examination.  Visit Complete: Virtual  I connected with  Kristin Lambert on 07/20/22 by a audio enabled telemedicine application and verified that I am speaking with the correct person using two identifiers.  Patient Location: Home  Provider Location: Office/Clinic  I discussed the limitations of evaluation and management by telemedicine. The patient expressed understanding and agreed to proceed.    Review of Systems     Cardiac Risk Factors include: advanced age (>66men, >65 women);dyslipidemia;hypertension;obesity (BMI >30kg/m2)     Objective:    Today's Vitals   07/20/22 1526  Weight: 251 lb (113.9 kg)  Height: 5\' 2"  (1.575 m)  PainSc: 7    Body mass index is 45.91 kg/m.     07/20/2022    3:36 PM 04/04/2021    3:06 PM 12/21/2020    7:40 AM 12/15/2020    3:40 PM 11/19/2020   11:01 AM 12/15/2019    2:23 PM  Advanced Directives  Does Patient Have a Medical Advance Directive? Yes Yes Yes Yes Yes Yes  Type of Estate agent of [place];Living will Healthcare Power of [place];Living will  Healthcare Power of [place];Living will Living will Healthcare Power of [place];Living will  Does patient want to make changes to medical advance directive?   No - Patient declined No - Patient declined No - Patient declined   Copy of Healthcare Power of Attorney in Chart? No - copy requested No - copy requested  No - copy requested  No - copy requested  Would patient like information on creating a medical advance directive?   No - Patient declined       Current Medications (verified) Outpatient Encounter Medications as of 07/20/2022  Medication Sig   acetaminophen (TYLENOL) 500 MG tablet Take 650 mg by mouth every 6 (six) hours as needed.   amLODipine (NORVASC) 2.5 MG tablet TAKE 1 TABLET BY MOUTH DAILY   apixaban (ELIQUIS) 5 MG TABS tablet TAKE 1 TABLET BY MOUTH  TWICE A DAY   cholecalciferol (VITAMIN D3) 25 MCG (1000 UNIT) tablet Take 1,000 Units by mouth daily.   fluticasone (FLONASE) 50 MCG/ACT nasal spray USE 2 SPRAYS IN BOTH NOSTRILS  DAILY   furosemide (LASIX) 20 MG tablet Take 1 tablet (20 mg total) by mouth daily as needed.   hydrochlorothiazide (HYDRODIURIL) 25 MG tablet TAKE 1 TABLET BY MOUTH DAILY   losartan (COZAAR) 100 MG tablet TAKE 1 TABLET BY MOUTH DAILY   metoprolol tartrate (LOPRESSOR) 100 MG tablet TAKE 1 TABLET BY MOUTH TWICE  DAILY   Multiple Vitamin (MULTIVITAMIN) tablet Take 1 tablet by mouth daily.   oxybutynin (DITROPAN) 5 MG tablet TAKE 1 TABLET BY MOUTH 4 TIMES  DAILY   potassium chloride (KLOR-CON) 10 MEQ tablet TAKE 1 TABLET BY MOUTH DAILY   simvastatin (ZOCOR) 40 MG tablet TAKE 1 TABLET BY MOUTH AT  BEDTIME   vitamin E 1000 UNIT capsule Take 1,000 Units by mouth daily.   trospium (SANCTURA) 20 MG tablet Take 1 tablet (20 mg total) by mouth 2 (two) times daily. (Patient not taking: Reported on 07/20/2022)   No facility-administered encounter medications on file as of 07/20/2022.    Allergies (verified) Nsaids   History: Past Medical History:  Diagnosis Date   Arthritis    Carpal tunnel syndrome    Hypertension    Pulmonary embolus (HCC)    "several years ago" , spontaneous pain in leg  after excercise class. no other symptoms, on ASA   Past Surgical History:  Procedure Laterality Date   ABDOMINAL HYSTERECTOMY     BIOPSY THYROID  05/2019   CARPAL TUNNEL RELEASE Right 12/21/2020   Procedure: RIGHT CARPAL TUNNEL RELEASE;  Surgeon: Marlyne Beards, MD;  Location: [facility];  Service: Orthopedics;  Laterality: Right;   CESAREAN SECTION     CHOLECYSTECTOMY     HAMMER TOE SURGERY Right    KNEE REPAIR EXTENSOR MECHANISM Right    MOLE REMOVAL     removed from neck, 2023   ORIF ANKLE FRACTURE Left    Family History  Problem Relation Age of Onset   Stroke Mother    Heart disease Mother    Heart  disease Sister    Hypertension Sister    Stroke Sister    Heart disease Brother    Hypertension Brother    Heart failure Brother    Breast cancer Neg Hx    Social History   Socioeconomic History   Marital status: Married    Spouse name: Not on file   Number of children: 1   Years of education: Not on file   Highest education level: Not on file  Occupational History   Occupation: Retired- Nursing    Comment: [employer]- [place], [place]  Tobacco Use   Smoking status: Never   Smokeless tobacco: Never  Vaping Use   Vaping Use: Never used  Substance and Sexual Activity   Alcohol use: Never   Drug use: Never   Sexual activity: Not Currently  Other Topics Concern   Not on file  Social History Narrative   Not on file   Social Determinants of Health   Financial Resource Strain: Low Risk  (07/20/2022)   Overall Financial Resource Strain (CARDIA)    Difficulty of Paying Living Expenses: Not hard at all  Food Insecurity: No Food Insecurity (07/20/2022)   Hunger Vital Sign    Worried About Running Out of Food in the Last Year: Never true    Ran Out of Food in the Last Year: Never true  Transportation Needs: No Transportation Needs (07/20/2022)   PRAPARE - Administrator, Civil Service (Medical): No    Lack of Transportation (Non-Medical): No  Physical Activity: Inactive (07/20/2022)   Exercise Vital Sign    Days of Exercise per Week: 0 days    Minutes of Exercise per Session: 0 min  Stress: Stress Concern Present (07/20/2022)   [employer] of Occupational Health - Occupational Stress Questionnaire    Feeling of Stress : To some extent  Social Connections: Moderately Isolated (07/20/2022)   Social Connection and Isolation Panel [NHANES]    Frequency of Communication with Friends and Family: Three times a week    Frequency of Social Gatherings with Friends and Family: More than three times a week    Attends Religious Services: Never    Database administrator or  Organizations: No    Attends Engineer, structural: Never    Marital Status: Married    Tobacco Counseling Counseling given: Not Answered   Clinical Intake:  Pre-visit preparation completed: Yes  Pain : 0-10 Pain Score: 7  Pain Type: Chronic pain Pain Location: Knee Pain Orientation: Left, Right Pain Descriptors / Indicators: Dull, Aching Pain Onset: More than a month ago Pain Frequency: Constant Pain Relieving Factors: APAP arthritis  Pain Relieving Factors: APAP arthritis  Nutritional Status: BMI > 30  Obese Nutritional Risks: None Diabetes:  No  How often do you need to have someone help you when you read instructions, pamphlets, or other written materials from your doctor or pharmacy?: 1 - Never  Interpreter Needed?: No  Information entered by :: NAllen LPN   Activities of Daily Living    07/20/2022    3:28 PM  In your present state of health, do you have any difficulty performing the following activities:  Hearing? 0  Vision? 1  Comment uses readers  Difficulty concentrating or making decisions? 0  Walking or climbing stairs? 1  Dressing or bathing? 0  Doing errands, shopping? 0  Preparing Food and eating ? N  Using the Toilet? N  In the past six months, have you accidently leaked urine? Y  Comment has bladder issues  Do you have problems with loss of bowel control? N  Managing your Medications? N  Managing your Finances? N  Housekeeping or managing your Housekeeping? N    Patient Care Team: Loyola Mast, MD as PCP - General (Family Medicine) Corky Crafts, MD as PCP - Cardiology (Cardiology) Gaspar Cola, [facility] (Inactive) as Pharmacist (Pharmacist)  Indicate any recent Medical Services you may have received from other than [facility] providers in the past year (date may be approximate).     Assessment:   This is a routine wellness examination for Bethany.  Hearing/Vision screen Hearing Screening - Comments:: Denies hearing  issues Vision Screening - Comments:: No regular eye exams  Dietary issues and exercise activities discussed:     Goals Addressed             This Visit's Progress    Patient Stated       07/20/2022, watch what she eats       Depression Screen    07/20/2022    3:38 PM 06/06/2022    3:17 PM 03/09/2022   11:49 AM 04/04/2021    3:08 PM 01/11/2021    9:33 AM 01/11/2021    8:59 AM 12/15/2019    2:28 PM  PHQ 2/9 Scores  PHQ - 2 Score 2 3 1  0 2 1 1   PHQ- 9 Score 8 6   5       Fall Risk    07/20/2022    3:37 PM 06/06/2022    3:17 PM 04/12/2021    8:56 AM 04/04/2021    3:07 PM 01/11/2021    9:00 AM  Fall Risk   Falls in the past year? 0 0 1 1 0  Number falls in past yr: 0 0 0 0 0  Injury with Fall? 0 0 1 0 0  Comment   sore tailbone    Risk for fall due to : Medication side effect;Impaired mobility;Impaired balance/gait No Fall Risks No Fall Risks Impaired balance/gait No Fall Risks  Follow up Falls prevention discussed;Falls evaluation completed Falls evaluation completed Falls evaluation completed  Falls evaluation completed    MEDICARE RISK AT HOME:  Medicare Risk at Home - 07/20/22 1537     Any stairs in or around the home? Yes    If so, are there any without handrails? No    Home free of loose throw rugs in walkways, pet beds, electrical cords, etc? Yes    Adequate lighting in your home to reduce risk of falls? Yes    Life alert? No    Use of a cane, walker or w/c? Yes    Grab bars in the bathroom? Yes    Shower chair or bench in shower?  Yes    Elevated toilet seat or a handicapped toilet? Yes             TIMED UP AND GO:  Was the test performed?  No    Cognitive Function:        07/20/2022    3:42 PM  6CIT Screen  What Year? 0 points  What month? 0 points  What time? 0 points  Count back from 20 0 points  Months in reverse 0 points  Repeat phrase 2 points  Total Score 2 points    Immunizations Immunization History  Administered Date(s)  Administered   Fluad Quad(high Dose 65+) 10/18/2020, 10/16/2021   PFIZER(Purple Top)SARS-COV-2 Vaccination 03/19/2019, 04/11/2019, 11/06/2019, 05/27/2020   Pfizer Covid-19 Vaccine Bivalent Booster 5y-11y 01/26/2021   Pneumococcal Polysaccharide-23 01/27/2019   Pneumococcal-Unspecified 11/02/2015   Zoster Recombinant(Shingrix) 09/22/2016    TDAP status: Due, Education has been provided regarding the importance of this vaccine. Advised may receive this vaccine at local pharmacy or Health Dept. Aware to provide a copy of the vaccination record if obtained from local pharmacy or Health Dept. Verbalized acceptance and understanding.  Flu Vaccine status: Up to date  Pneumococcal vaccine status: Up to date  Covid-19 vaccine status: Completed vaccines  Qualifies for Shingles Vaccine? Yes   Zostavax completed No   Shingrix Completed?: needs second dose  Screening Tests Health Maintenance  Topic Date Due   DTaP/Tdap/Td (1 - Tdap) Never done   DEXA SCAN  Never done   Zoster Vaccines- Shingrix (2 of 2) 11/17/2016   COVID-19 Vaccine (6 - 2023-24 season) 09/15/2021   MAMMOGRAM  11/02/2021   INFLUENZA VACCINE  08/16/2022   Medicare Annual Wellness (AWV)  07/20/2023   Colonoscopy  12/14/2029   Hepatitis C Screening  Completed   HPV VACCINES  Aged Out   Pneumonia Vaccine 57+ Years old  Discontinued    Health Maintenance  Health Maintenance Due  Topic Date Due   DTaP/Tdap/Td (1 - Tdap) Never done   DEXA SCAN  Never done   Zoster Vaccines- Shingrix (2 of 2) 11/17/2016   COVID-19 Vaccine (6 - 2023-24 season) 09/15/2021   MAMMOGRAM  11/02/2021    Colorectal cancer screening: Type of screening: Cologuard. Completed 12/15/2019. Repeat every 3 years  Mammogram status: scheduled for 08/09/2022  Bone Density status: scheduled for 09/11/2022  Lung Cancer Screening: (Low Dose CT Chest recommended if Age 53-80 years, 20 pack-year currently smoking OR have quit w/in 15years.) does not qualify.    Lung Cancer Screening Referral: no  Additional Screening:  Hepatitis C Screening: does qualify; Completed 04/12/2021  Vision Screening: Recommended annual ophthalmology exams for early detection of glaucoma and other disorders of the eye. Is the patient up to date with their annual eye exam?  No  Who is the provider or what is the name of the office in which the patient attends annual eye exams? none If pt is not established with a provider, would they like to be referred to a provider to establish care? No .   Dental Screening: Recommended annual dental exams for proper oral hygiene  Diabetic Foot Exam: n/a  Community Resource Referral / Chronic Care Management: CRR required this visit?  No   CCM required this visit?  No     Plan:     I have personally reviewed and noted the following in the patient's chart:   Medical and social history Use of alcohol, tobacco or illicit drugs  Current medications and supplements including opioid prescriptions.  Patient is not currently taking opioid prescriptions. Functional ability and status Nutritional status Physical activity Advanced directives List of other physicians Hospitalizations, surgeries, and ER visits in previous 12 months Vitals Screenings to include cognitive, depression, and falls Referrals and appointments  In addition, I have reviewed and discussed with patient certain preventive protocols, quality metrics, and best practice recommendations. A written personalized care plan for preventive services as well as general preventive health recommendations were provided to patient.     Barb Merino, LPN   01/20/1094   After Visit Summary: (MyChart) Due to this being a telephonic visit, the after visit summary with patients personalized plan was offered to patient via MyChart   Nurse Notes: none

## 2022-07-27 ENCOUNTER — Other Ambulatory Visit: Payer: Self-pay | Admitting: Interventional Cardiology

## 2022-07-27 DIAGNOSIS — I4819 Other persistent atrial fibrillation: Secondary | ICD-10-CM

## 2022-07-27 NOTE — Telephone Encounter (Signed)
Prescription refill request for Eliquis received. Indication:afib Last office visit:10/23 Scr:1.02  5/24 Age: 72 Weight:113.9  kg  Prescription refilled

## 2022-08-09 ENCOUNTER — Ambulatory Visit
Admission: RE | Admit: 2022-08-09 | Discharge: 2022-08-09 | Disposition: A | Discharge status: Home / Self Care | Payer: Medicare Other | Source: Ambulatory Visit | Attending: Family Medicine | Admitting: Family Medicine

## 2022-08-09 ENCOUNTER — Telehealth: Payer: Self-pay

## 2022-08-09 DIAGNOSIS — Z1231 Encounter for screening mammogram for malignant neoplasm of breast: Secondary | ICD-10-CM | POA: Diagnosis not present

## 2022-08-09 NOTE — Telephone Encounter (Signed)
   Patient Name: Kristin Lambert  DOB: 07/11/1950 MRN: [id number]  Primary Cardiologist: Lance Muss, MD  Chart reviewed as part of pre-operative protocol coverage.   IF SIMPLE EXTRACTION/CLEANINGS: Simple dental extractions (i.e. 1-2 teeth) are considered low risk procedures per guidelines and generally do not require any specific cardiac clearance. It is also generally accepted that for simple extractions and dental cleanings, there is no need to interrupt blood thinner therapy.   SBE prophylaxis is not required for the patient from a cardiac standpoint.  I will route this recommendation to the requesting party via Epic fax function and remove from pre-op pool.  Please call with questions.  Joni Reining, NP 08/09/2022, 9:39 AM

## 2022-08-09 NOTE — Telephone Encounter (Signed)
   Pre-operative Risk Assessment    Patient Name: Kristin Lambert  DOB: 01-Mar-1950 MRN: [id number]     Request for Surgical Clearance    Procedure:  Dental Extraction - Amount of Teeth to be Pulled:  1  Date of Surgery:  Clearance TBD                                 Surgeon: Dr. April Holding Surgeon's Group or Practice Name:  [facility] Phone number:  [phone] Fax number:  [phone]   Type of Clearance Requested:   - Medical  - Pharmacy:  Hold Apixaban (Eliquis) 3-5 days   Type of Anesthesia:  Not Indicated   Additional requests/questions:    Kristin Lambert   08/09/2022, 9:19 AM

## 2022-08-30 ENCOUNTER — Encounter (INDEPENDENT_AMBULATORY_CARE_PROVIDER_SITE_OTHER): Payer: Self-pay

## 2022-09-11 ENCOUNTER — Other Ambulatory Visit: Payer: Medicare Other

## 2022-10-04 DIAGNOSIS — L84 Corns and callosities: Secondary | ICD-10-CM | POA: Diagnosis not present

## 2022-10-04 DIAGNOSIS — B351 Tinea unguium: Secondary | ICD-10-CM | POA: Diagnosis not present

## 2022-10-04 DIAGNOSIS — M79675 Pain in left toe(s): Secondary | ICD-10-CM | POA: Diagnosis not present

## 2022-10-04 DIAGNOSIS — B353 Tinea pedis: Secondary | ICD-10-CM | POA: Diagnosis not present

## 2022-10-10 ENCOUNTER — Ambulatory Visit (INDEPENDENT_AMBULATORY_CARE_PROVIDER_SITE_OTHER): Payer: Medicare Other | Admitting: Family Medicine

## 2022-10-10 VITALS — BP 122/84 | HR 68 | Temp 97.5°F | Ht 62.0 in | Wt 251.0 lb

## 2022-10-10 DIAGNOSIS — I10 Essential (primary) hypertension: Secondary | ICD-10-CM | POA: Diagnosis not present

## 2022-10-10 DIAGNOSIS — E782 Mixed hyperlipidemia: Secondary | ICD-10-CM | POA: Diagnosis not present

## 2022-10-10 DIAGNOSIS — Z23 Encounter for immunization: Secondary | ICD-10-CM | POA: Diagnosis not present

## 2022-10-10 DIAGNOSIS — F329 Major depressive disorder, single episode, unspecified: Secondary | ICD-10-CM | POA: Insufficient documentation

## 2022-10-10 DIAGNOSIS — I4819 Other persistent atrial fibrillation: Secondary | ICD-10-CM | POA: Diagnosis not present

## 2022-10-10 NOTE — Assessment & Plan Note (Signed)
Blood pressure is in adequate control. Continue amlodipine 2.5 mg daily, losartan 100 mg daily, and HCTZ 25 mg daily.

## 2022-10-10 NOTE — Assessment & Plan Note (Signed)
I offered medication, but Kristin Lambert is not ready for this. She does have a network of family and friends that she finds supportive along with her Hospice connections.

## 2022-10-10 NOTE — Assessment & Plan Note (Signed)
Lipids are at goal. Continue simvastatin 40 mg daily.

## 2022-10-10 NOTE — Progress Notes (Signed)
[facility] [address] [place] [address] Dept: [phone] Dept Fax: [phone]  Chronic Care Office Visit  Subjective:    Patient ID: Kristin Lambert, female    DOB: [birth year], 72 y.o..   MRN: [id number]  Chief Complaint  Patient presents with   Hypertension    4 month f/u.  C/o having pain/stiffness in hands.   History of Present Illness:  Patient is in today for reassessment of chronic medical issues.  Kristin Lambert has a history of hypertension, managed with amlodipine 2.5 mg daily, losartan 100 mg daily, and HCTZ 25 mg daily. She is also on some occasional Lasix related to pedal edema and takes a potassium supplement.   Kristin Lambert has a history of atrial fibrillation. She is followed by Dr. Eldridge Dace (cardiology). Currently, her rate has been well controlled. Kristin Lambert is on metoprolol tartrate 100 mg bid. She is also on apixiban (Eliquis) 5 mg twice daily for anticoagulation.   Kristin Lambert has a history of hyperlipidemia and is managed on simvastatin 40 mg dialy.    Kristin Lambert husband has has progressive decline in his dementia and with mobility. He remains under Hospice care. We discussed her struggles with this and the depression/grief she is experiencing as his health worsens.  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 10/18/2020   Multiple thyroid nodules 10/18/2020   Morbid obesity with BMI of 45.0-49.9, adult (HCC) 11/13/2019   Vitamin D deficiency 07/30/2019   Hyperlipidemia 07/30/2019   Osteoarthritis of knees, bilateral 06/19/2019   Essential hypertension 06/19/2019   Past Surgical History:   Procedure Laterality Date   ABDOMINAL HYSTERECTOMY     BIOPSY THYROID  05/2019   CARPAL TUNNEL RELEASE Right 12/21/2020   Procedure: RIGHT CARPAL TUNNEL RELEASE;  Surgeon: Marlyne Beards, MD;  Location: [facility];  Service: Orthopedics;  Laterality: Right;   CESAREAN SECTION     CHOLECYSTECTOMY     HAMMER TOE SURGERY Right    KNEE REPAIR EXTENSOR MECHANISM Right    MOLE REMOVAL     removed from neck, 2023   ORIF ANKLE FRACTURE Left    Family History  Problem Relation Age of Onset   Stroke Mother    Heart disease Mother    Heart disease Sister    Hypertension Sister    Stroke Sister    Heart disease Brother    Hypertension Brother    Heart failure Brother    Breast cancer Neg Hx    Outpatient Medications Prior to Visit  Medication Sig Dispense Refill   acetaminophen (TYLENOL) 500 MG tablet Take 650 mg by mouth every 6 (six) hours as needed.     amLODipine (NORVASC) 2.5 MG tablet TAKE 1 TABLET BY MOUTH DAILY 100 tablet 2   cholecalciferol (VITAMIN D3) 25 MCG (1000 UNIT) tablet Take 1,000 Units by mouth daily.     ELIQUIS 5 MG TABS tablet TAKE 1 TABLET BY MOUTH TWICE A DAY 60 tablet 5   fluticasone (FLONASE) 50 MCG/ACT nasal spray USE 2 SPRAYS IN BOTH NOSTRILS  DAILY 48 g 2   furosemide (LASIX) 20 MG tablet Take 1 tablet (20 mg total) by mouth daily as needed. 90  tablet 3   hydrochlorothiazide (HYDRODIURIL) 25 MG tablet TAKE 1 TABLET BY MOUTH DAILY 100 tablet 2   ketoconazole (NIZORAL) 2 % cream Apply 1 Application topically daily.     losartan (COZAAR) 100 MG tablet TAKE 1 TABLET BY MOUTH DAILY 90 tablet 3   metoprolol tartrate (LOPRESSOR) 100 MG tablet TAKE 1 TABLET BY MOUTH TWICE  DAILY 180 tablet 3   Multiple Vitamin (MULTIVITAMIN) tablet Take 1 tablet by mouth daily.     oxybutynin (DITROPAN) 5 MG tablet TAKE 1 TABLET BY MOUTH 4 TIMES  DAILY 360 tablet 3   potassium chloride (KLOR-CON) 10 MEQ tablet TAKE 1 TABLET BY MOUTH DAILY 100 tablet 2   simvastatin  (ZOCOR) 40 MG tablet TAKE 1 TABLET BY MOUTH AT  BEDTIME 90 tablet 3   vitamin E 1000 UNIT capsule Take 1,000 Units by mouth daily.     trospium (SANCTURA) 20 MG tablet Take 1 tablet (20 mg total) by mouth 2 (two) times daily. (Patient not taking: Reported on 07/20/2022) 60 tablet 3   No facility-administered medications prior to visit.   Allergies  Allergen Reactions   Nsaids Rash   Objective:   Today's Vitals   10/10/22 1542  BP: 122/84  Pulse: 68  Temp: (!) 97.5 F (36.4 C)  TempSrc: Temporal  SpO2: 98%  Weight: 251 lb (113.9 kg)  Height: 5\' 2"  (1.575 m)   Body mass index is 45.91 kg/m.   General: Well developed, well nourished. No acute distress. Psych: Alert and oriented. Normal mood and affect.  Health Maintenance Due  Topic Date Due   DTaP/Tdap/Td (1 - Tdap) Never done   DEXA SCAN  Never done   Zoster Vaccines- Shingrix (2 of 2) 11/17/2016   INFLUENZA VACCINE  08/16/2022     Assessment & Plan:   Problem List Items Addressed This Visit       Cardiovascular and Mediastinum   Atrial fibrillation (HCC) - Primary    Appears to be in sinus rhythm at present. Rate controlled. Continue metoprolol tartrate 100 mg bid and apixaban (Eliquis) 5 mg twice daily.      Essential hypertension    Blood pressure is in adequate control. Continue amlodipine 2.5 mg daily, losartan 100 mg daily, and HCTZ 25 mg daily.        Other   Hyperlipidemia    Lipids are at goal. Continue simvastatin 40 mg daily.      Reactive depression    I offered medication, but Kristin Lambert is not ready for this. She does have a network of family and friends that she finds supportive along with her Hospice connections.      Other Visit Diagnoses     Need for immunization against influenza       Relevant Orders   Flu Vaccine Trivalent High Dose (Fluad) (Completed)       Return in about 4 months (around 02/09/2023) for Reassessment.   Loyola Mast, MD

## 2022-10-10 NOTE — Assessment & Plan Note (Addendum)
Appears to be in sinus rhythm at present. Rate controlled. Continue metoprolol tartrate 100 mg bid and apixaban (Eliquis) 5 mg twice daily.

## 2022-10-24 ENCOUNTER — Encounter: Payer: Self-pay | Admitting: Family Medicine

## 2022-11-11 ENCOUNTER — Other Ambulatory Visit: Payer: Self-pay | Admitting: Family Medicine

## 2022-11-11 DIAGNOSIS — I10 Essential (primary) hypertension: Secondary | ICD-10-CM

## 2022-11-11 NOTE — Progress Notes (Deleted)
Cardiology Office Note:   Date:  11/11/2022  ID:  Kristin Lambert, DOB 1950-08-13, MRN [id number] PCP:  Loyola Mast, MD  [facility] Providers Cardiologist:  Alverda Skeans, MD Referring MD: Loyola Mast, MD  Chief Complaint/Reason for Referral: Cardiology follow-up ASSESSMENT:    1. Paroxysmal atrial fibrillation (HCC)   2. Secondary hypercoagulable state (HCC)   3. Essential hypertension   4. Dyslipidemia   5. Stage 3a chronic kidney disease (HCC)   6. BMI 40.0-44.9, adult (HCC)     PLAN:   In order of problems listed above: Paroxysmal atrial fibrillation: Continue Eliquis and metoprolol. Secondary hypercoagulable state: Continue Eliquis. Hypertension:*** Hyperlipidemia: The patient has no indications for intensive lipid-lowering.  Her last LDL in March 2023 was 44.  Will check lipid panel and LFTs today. Stage III chronic kidney disease: Continue losartan for renal protection and strict blood pressure control. Elevated BMI: Will check hemoglobin A1c today and if elevated will inform PCP and refer to pharmacy for recommendations regarding GLP-1 receptor agonist therapy.***        {Are you ordering a CV Procedure (e.g. stress test, cath, DCCV, TEE, etc)?   Press F2        :[id number]}   Dispo:  No follow-ups on file.      Medication Adjustments/Labs and Tests Ordered: Current medicines are reviewed at length with the patient today.  Concerns regarding medicines are outlined above.  The following changes have been made:  {PLAN; NO CHANGE:13088:s}   Labs/tests ordered: No orders of the defined types were placed in this encounter.   Medication Changes: No orders of the defined types were placed in this encounter.   Current medicines are reviewed at length with the patient today.  The patient {ACTIONS; HAS/DOES NOT HAVE:19233} concerns regarding medicines.  I spent *** minutes reviewing all clinical data during and prior to this visit including all relevant  imaging studies, laboratories, clinical information from other health systems, and prior notes from both Cardiology and other specialties, interviewing the patient, and conducting a complete physical examination in order to formulate a comprehensive and personalized evaluation and treatment plan.  History of Present Illness:      FOCUSED PROBLEM LIST:   Paroxysmal atrial fibrillation CV 2 score of 4 On Eliquis 5 mg twice daily Hypertension Hyperlipidemia Stage III chronic kidney disease BMI 44  October 2024: The patient is seen for routine follow-up.  The patient was last seen in October 2023.  She was doing well with rare palpitations          Current Medications: No outpatient medications have been marked as taking for the 11/14/22 encounter (Appointment) with Orbie Pyo, MD.     Review of Systems:   Please see the history of present illness.    All other systems reviewed and are negative.     EKGs/Labs/Other Test Reviewed:   EKG: EKG performed January 2023 demonstrates rate controlled atrial fibrillation  EKG Interpretation Date/Time:    Ventricular Rate:    PR Interval:    QRS Duration:    QT Interval:    QTC Calculation:   R Axis:      Text Interpretation:           Risk Assessment/Calculations:   {Does this patient have ATRIAL FIBRILLATION?:[id number]}      Physical Exam:   VS:  There were no vitals taken for this visit.   No BP recorded.  {Refresh Note OR Click here to enter BP  :1}***  Wt Readings from Last 3 Encounters:  10/10/22 251 lb (113.9 kg)  07/20/22 251 lb (113.9 kg)  06/06/22 254 lb 6.4 oz (115.4 kg)      GENERAL:  No apparent distress, AOx3 HEENT:  No carotid bruits, +2 carotid impulses, no scleral icterus CAR: RRR Irregular RR*** no murmurs***, gallops, rubs, or thrills RES:  Clear to auscultation bilaterally ABD:  Soft, nontender, nondistended, positive bowel sounds x 4 VASC:  +2 radial pulses, +2 carotid pulses NEURO:  CN  2-12 grossly intact; motor and sensory grossly intact PSYCH:  No active depression or anxiety EXT:  No edema, ecchymosis, or cyanosis  Signed, Orbie Pyo, MD  11/11/2022 9:16 AM    [facility] [address] [place], [place], [place]  [address] Phone: [phone]; Fax: [phone]   Note:  This document was prepared using Dragon voice recognition software and may include unintentional dictation errors.

## 2022-11-13 ENCOUNTER — Telehealth: Payer: Self-pay | Admitting: Family Medicine

## 2022-11-13 NOTE — Telephone Encounter (Signed)
Pt want to let you know her husband has passed away on 2022-12-10

## 2022-11-13 NOTE — Telephone Encounter (Signed)
Lft VM to rtn call. Dm/cma  

## 2022-11-14 ENCOUNTER — Ambulatory Visit: Payer: Medicare Other | Admitting: Internal Medicine

## 2022-11-14 DIAGNOSIS — I10 Essential (primary) hypertension: Secondary | ICD-10-CM

## 2022-11-14 DIAGNOSIS — I480 Paroxysmal atrial fibrillation: Secondary | ICD-10-CM

## 2022-11-14 DIAGNOSIS — D6869 Other thrombophilia: Secondary | ICD-10-CM

## 2022-11-14 DIAGNOSIS — Z6841 Body Mass Index (BMI) 40.0 and over, adult: Secondary | ICD-10-CM

## 2022-11-14 DIAGNOSIS — E785 Hyperlipidemia, unspecified: Secondary | ICD-10-CM

## 2022-11-14 DIAGNOSIS — N1831 Chronic kidney disease, stage 3a: Secondary | ICD-10-CM

## 2022-11-14 NOTE — Telephone Encounter (Signed)
Patient returned call.  She states that they are having a Equities trader for [place] at 2:00 pm at [facility].    They will be taking the ashes to [place] [facility]. Dm/cma

## 2022-12-02 ENCOUNTER — Other Ambulatory Visit: Payer: Self-pay | Admitting: Family Medicine

## 2022-12-02 DIAGNOSIS — N393 Stress incontinence (female) (male): Secondary | ICD-10-CM

## 2022-12-10 ENCOUNTER — Ambulatory Visit: Payer: Medicare Other | Attending: Cardiology | Admitting: Cardiology

## 2022-12-10 ENCOUNTER — Encounter: Payer: Self-pay | Admitting: Cardiology

## 2022-12-10 VITALS — BP 150/66 | HR 55 | Ht 62.0 in | Wt 251.8 lb

## 2022-12-10 DIAGNOSIS — E782 Mixed hyperlipidemia: Secondary | ICD-10-CM | POA: Diagnosis not present

## 2022-12-10 DIAGNOSIS — I6523 Occlusion and stenosis of bilateral carotid arteries: Secondary | ICD-10-CM

## 2022-12-10 DIAGNOSIS — I4819 Other persistent atrial fibrillation: Secondary | ICD-10-CM | POA: Diagnosis not present

## 2022-12-10 DIAGNOSIS — R600 Localized edema: Secondary | ICD-10-CM | POA: Diagnosis not present

## 2022-12-10 MED ORDER — AMLODIPINE BESYLATE 5 MG PO TABS: 5.0000 mg | ORAL_TABLET | Freq: Every day | ORAL | 1 refills | Status: DC

## 2022-12-10 NOTE — Progress Notes (Signed)
Cardiology Office Note:   Date:  12/10/2022  ID:  Kristin Lambert, DOB 11-25-1950, MRN [id number] PCP: Loyola Mast, MD   HeartCare Providers Cardiologist:  Lance Muss, MD    History of Present Illness:   Discussed the use of AI scribe software for clinical note transcription with the patient, who gave verbal consent to proceed.  History of Present Illness   The patient is a 72 year old individual with a history of atrial fibrillation, hypertension, and intermittent pedal edema. She was diagnosed with atrial fibrillation during a preoperative examination for right carpal tunnel release. 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 Reportedly collateral circulation with no indication for revascularization.   The patient reports feeling generally well, with occasional "flutters" that are managed by taking deep breaths. She has experienced persistent leg weakness since contracting COVID-19 which limits mobility. She also reports a recent increase in stress due to the passing of her spouse, who had Alzheimer's and required significant care at home.  The patient uses a cane and walker for stability due to unsteadiness in her gait, which she attributes to decreased physical activity. She has not experienced any recent falls. She is currently on amlodipine, Eliquis, and Lasix (as needed). She reports taking Lasix about once a week when her ankles swell, particularly on the side of an old fracture.  The patient denies any chest pain or shortness of breath since her last cardiology appointment a year ago. She is also on simvastatin for cholesterol management, with her last LDL level recorded as 44. She remains in AFib, as shown on her EKG, but expresses a preference against cardioversion unless absolutely necessary.  The patient also reports arthritis in her hands and knees, which she manages with regular Tylenol use.  She has a fear of falling, especially when walking alone, which has limited her physical activity. She has not had an ultrasound of her neck this year, but typically has them annually due to her carotid artery stenosis, ordered by PCP. She reports occasional dizziness upon standing, which she attributes to her carotid artery condition.  The patient's blood pressure has been consistently high, often in the 140s to 160s per home readings.   Today patient denies chest pain, shortness of breath, lower extremity edema, fatigue, palpitations, melena, hematuria, hemoptysis, diaphoresis, weakness, presyncope, syncope, orthopnea, and PND.   Studies Reviewed:    EKG:   EKG Interpretation Date/Time:  Monday December 10 2022 14:37:32 EST Ventricular Rate:  55 PR Interval:    QRS Duration:  84 QT Interval:  446 QTC Calculation: 426 R Axis:   -4  Text Interpretation: Atrial fibrillation with slow ventricular response Minimal voltage criteria for LVH, may be normal variant ( R in aVL ) When compared with ECG of 16-Dec-2020 12:19, No significant change was found Confirmed by Perlie Gold [phone]) on 12/10/2022 10:17:55 PM      Risk Assessment/Calculations:    CHA2DS2-VASc Score = 4   This indicates a 4.8% annual risk of stroke. The patient's score is based upon: CHF History: 0 HTN History: 1 Diabetes History: 0 Stroke History: 0 Vascular Disease History: 1 Age Score: 1 Gender Score: 1    HYPERTENSION CONTROL Vitals:   12/10/22 1415 12/10/22 1435  BP: (!) 148/60 (!) 150/66    The patient's blood pressure is elevated above target today.  In order to address the patient's elevated BP: A current anti-hypertensive medication was adjusted today.  Physical Exam:   VS:  BP (!) 150/66   Pulse (!) 55   Ht 5\' 2"  (1.575 m)   Wt 251 lb 12.8 oz (114.2 kg)   SpO2 97%   BMI 46.05 kg/m    Wt Readings from Last 3 Encounters:  12/10/22 251 lb 12.8 oz (114.2 kg)  10/10/22 251 lb  (113.9 kg)  07/20/22 251 lb (113.9 kg)     Physical Exam Vitals reviewed.  Constitutional:      Appearance: Normal appearance.  HENT:     Head: Normocephalic.     Nose: Nose normal.  Eyes:     Pupils: Pupils are equal, round, and reactive to light.  Cardiovascular:     Rate and Rhythm: Bradycardia present. Rhythm irregular.     Pulses: Normal pulses.     Heart sounds: Normal heart sounds. No murmur heard.    No friction rub. No gallop.  Pulmonary:     Effort: Pulmonary effort is normal.     Breath sounds: Normal breath sounds.  Musculoskeletal:     Right lower leg: No edema.     Left lower leg: No edema.  Skin:    General: Skin is warm and dry.     Capillary Refill: Capillary refill takes less than 2 seconds.  Neurological:     General: No focal deficit present.     Mental Status: She is alert and oriented to person, place, and time.  Psychiatric:        Mood and Affect: Mood normal.        Behavior: Behavior normal.        Thought Content: Thought content normal.        Judgment: Judgment normal.    ASSESSMENT AND PLAN:     Assessment and Plan    Atrial Fibrillation Chronic atrial fibrillation, persistent for nearly three years. Mild atrial dilation noted on echocardiogram in February 2023. Currently asymptomatic with good rate control on metoprolol. Discussed risks of chronic untreated AFib, including increased hospitalizations and heart failure. Shared decision-making: patient prefers to continue current management without cardioversion. - Continue metoprolol tartrate 100mg  BID - Continue Eliquis 5mg  BID - Monitor symptoms and rate control  Hypertension Hypertension with current readings in the 140s-160s systolic. Current medications include amlodipine, losartan, hydrochlorothiazide, and metoprolol. Discussed increasing amlodipine dose and potential side effects, including increased leg swelling. Consider switching losartan to valsartan if blood pressure remains  uncontrolled. 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 Prior notes report collateral circulation and asymptomatic. - Continue annual carotid artery duplex with orders from PCP.  - Continue statin  Hyperlipidemia Hyperlipidemia managed with simvastatin 40 mg. Last LDL in March 2023 was 44. - Check LDL direct today - Continue simvastatin 40 mg  Edema Intermittent pedal edema, primarily in the left ankle, likely related to old fracture and possibly lymphedema. Prior echocardiograms without evidence of CHF and patient otherwise without CHF symptoms.  - Consider using compression socks or wraps for lower legs - Continue Lasix as needed  General Health Maintenance Routine health maintenance including monitoring of liver function due to chronic acetaminophen use. - Check liver  function tests (LFTs) today - Forward LFT results to primary care physician  Follow-up - Schedule follow-up appointment in six months with Dr. Anne Fu            Signed, Perlie Gold, PA-C

## 2022-12-10 NOTE — Patient Instructions (Addendum)
Medication Instructions:  INCREASE Norvasc to 5mg  Take 1 tablet twice a day  *If you need a refill on your cardiac medications before your next appointment, please call your pharmacy*   Lab Work: TODAY- DIRECT LDL, LFT If you have labs (blood work) drawn today and your tests are completely normal, you will receive your results only by: MyChart Message (if you have MyChart) OR A paper copy in the mail If you have any lab test that is abnormal or we need to change your treatment, we will call you to review the results.   Testing/Procedures: NONE ORDERED   Follow-Up: At [facility], you and your health needs are our priority.  As part of our continuing mission to provide you with exceptional heart care, we have created designated Provider Care Teams.  These Care Teams include your primary Cardiologist (physician) and Advanced Practice Providers (APPs -  Physician Assistants and Nurse Practitioners) who all work together to provide you with the care you need, when you need it.  We recommend signing up for the patient portal called "MyChart".  Sign up information is provided on this After Visit Summary.  MyChart is used to connect with patients for Virtual Visits (Telemedicine).  Patients are able to view lab/test results, encounter notes, upcoming appointments, etc.  Non-urgent messages can be sent to your provider as well.   To learn more about what you can do with MyChart, go to [email or web address].    Your next appointment:   6 month(s)  Provider:   Perlie Gold, PA-C       Other Instructions

## 2022-12-11 LAB — HEPATIC FUNCTION PANEL
ALT: 10 [IU]/L (ref 0–32)
AST: 13 [IU]/L (ref 0–40)
Albumin: 4.5 g/dL (ref 3.8–4.8)
Alkaline Phosphatase: 84 [IU]/L (ref 44–121)
Bilirubin Total: 0.4 mg/dL (ref 0.0–1.2)
Bilirubin, Direct: 0.17 mg/dL (ref 0.00–0.40)
Total Protein: 7.1 g/dL (ref 6.0–8.5)

## 2022-12-11 LAB — LDL CHOLESTEROL, DIRECT: LDL Direct: 60 mg/dL (ref 0–99)

## 2022-12-31 ENCOUNTER — Telehealth: Payer: Self-pay

## 2022-12-31 NOTE — Telephone Encounter (Signed)
-----   Message from Perlie Gold sent at 12/31/2022 12:41 PM EST ----- Regarding: BP log Please call patient to inquire about recent blood pressure readings. Thanks!  Perlie Gold, PA-C

## 2022-12-31 NOTE — Telephone Encounter (Signed)
I called to follow up with the pt and see how her BP has been doing...  She reports that she has been feeling well and her readings over the past few days:  Starting 12/23/22 with a few missed days:   118/78 135/76 106/61 113/78 120/65 140/80 today the highest she says she seen in several days at 11:30 am.   She checks her BP randomly... usually around noon.     Will forward to Perlie Gold PA for his review. Pt says she currently has a minor cold. Not taking any OTC meds.

## 2023-01-03 DIAGNOSIS — L84 Corns and callosities: Secondary | ICD-10-CM | POA: Diagnosis not present

## 2023-01-03 DIAGNOSIS — B351 Tinea unguium: Secondary | ICD-10-CM | POA: Diagnosis not present

## 2023-01-03 DIAGNOSIS — M79675 Pain in left toe(s): Secondary | ICD-10-CM | POA: Diagnosis not present

## 2023-01-03 NOTE — Telephone Encounter (Signed)
Left voicemail to return call to office.

## 2023-01-03 NOTE — Telephone Encounter (Signed)
Follow Up:      Patient is returning a call from today(01-03-23)

## 2023-01-03 NOTE — Telephone Encounter (Signed)
Spoke with patient and she will continue to keep track of her BP

## 2023-01-17 ENCOUNTER — Telehealth: Payer: Self-pay | Admitting: Cardiology

## 2023-01-17 DIAGNOSIS — I4819 Other persistent atrial fibrillation: Secondary | ICD-10-CM

## 2023-01-17 MED ORDER — APIXABAN 5 MG PO TABS
5.0000 mg | ORAL_TABLET | Freq: Two times a day (BID) | ORAL | 5 refills | Status: DC
Start: 2023-01-17 — End: 2023-01-17

## 2023-01-17 MED ORDER — APIXABAN 5 MG PO TABS: 5.0000 mg | ORAL_TABLET | Freq: Two times a day (BID) | ORAL | 1 refills | Status: DC

## 2023-01-17 NOTE — Telephone Encounter (Signed)
 Prescription refill request for Eliquis received. Indication: Afib Last office visit: 12/10/22 Mayford Knife)  Scr: 1.02 (06/06/22)  Age: 73 Weight: 114.2kg  Appropriate dose. Refill sent.

## 2023-01-17 NOTE — Telephone Encounter (Signed)
*  STAT* If patient is at the pharmacy, call can be transferred to refill team.   1. Which medications need to be refilled? (please list name of each medication and dose if known)   ELIQUIS  5 MG TABS tablet    2. Which pharmacy/location (including street and city if local pharmacy) is medication to be sent to? [facility] [place], [place] - [address] [place] Phone: [phone]  Fax: [phone]     3. Do they need a 30 day or 90 day supply? 90

## 2023-01-17 NOTE — Addendum Note (Signed)
 Addended by: Betsy Coder B on: 01/17/2023 02:35 PM   Modules accepted: Orders

## 2023-02-07 ENCOUNTER — Other Ambulatory Visit: Payer: Self-pay

## 2023-02-07 MED ORDER — HYDROCHLOROTHIAZIDE 25 MG PO TABS: 25.0000 mg | ORAL_TABLET | Freq: Every day | ORAL | 2 refills | Status: DC

## 2023-02-13 ENCOUNTER — Encounter: Payer: Self-pay | Admitting: Family Medicine

## 2023-02-13 ENCOUNTER — Ambulatory Visit: Payer: Medicare Other | Admitting: Family Medicine

## 2023-02-13 VITALS — BP 130/84 | HR 67 | Temp 98.1°F | Ht 62.0 in | Wt 258.0 lb

## 2023-02-13 DIAGNOSIS — I10 Essential (primary) hypertension: Secondary | ICD-10-CM | POA: Diagnosis not present

## 2023-02-13 DIAGNOSIS — I4819 Other persistent atrial fibrillation: Secondary | ICD-10-CM

## 2023-02-13 DIAGNOSIS — E782 Mixed hyperlipidemia: Secondary | ICD-10-CM

## 2023-02-13 DIAGNOSIS — F4321 Adjustment disorder with depressed mood: Secondary | ICD-10-CM | POA: Diagnosis not present

## 2023-02-13 NOTE — Assessment & Plan Note (Signed)
Blood pressure is in adequate control. Continue amlodipine 5 mg daily, losartan 100 mg daily, and HCTZ 25 mg daily.

## 2023-02-13 NOTE — Assessment & Plan Note (Signed)
Stable. Rate controlled. Continue metoprolol tartrate 100 mg bid and apixaban (Eliquis) 5 mg twice daily.

## 2023-02-13 NOTE — Assessment & Plan Note (Signed)
Lipids are at goal. Continue simvastatin 40 mg daily.

## 2023-02-13 NOTE — Progress Notes (Signed)
[facility]ARE [facility] [address] [place] [address] Dept: [phone] Dept Fax: [phone]  Chronic Care Office Visit  Subjective:    Patient ID: Kristin Lambert, female    DOB: 07-22-1950, 73 y.o..   MRN: [id number]  Chief Complaint  Patient presents with   Follow-up    4 month f/u.     History of Present Illness:  Patient is in today for reassessment of chronic medical issues.  Since her last visit, Ms. Kristin Lambert's husband died. She continues to grieve for his loss. She feels she is slowly adjusting to his death. She admits she has a tendency to isolate at home and does feel lonely at times. However, she does spend some time with her daughter's family and she has a network of neighbors that she checks in with. She  has not been able to fully return to work. She feels she needs more time to work through her grief issues at her own pace.   [name] has a history of hypertension, managed with amlodipine 5 mg daily, losartan 100 mg daily, and HCTZ 25 mg daily. She is also on some occasional Lasix related to pedal edema and takes a potassium supplement.   Ms. Kristin Lambert has a history of atrial fibrillation. She is followed by Dr. Eldridge Dace (cardiology). Currently, her rate has been well controlled. Ms. Kristin Lambert is on metoprolol tartrate 100 mg bid. She is also on apixaban (Eliquis) 5 mg twice daily for anticoagulation. She notes the PA with her cardiologist has offered elective cardioversion, but she is not ready to proceed with this.   Ms. Kristin Lambert has a history of hyperlipidemia and is managed on simvastatin 40 mg daily.   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 10/18/2020   Multiple thyroid nodules 10/18/2020   Morbid obesity with BMI of 45.0-49.9, adult (HCC) 11/13/2019   Vitamin D deficiency 07/30/2019   Hyperlipidemia 07/30/2019   Osteoarthritis of knees, bilateral 06/19/2019   Essential hypertension 06/19/2019   Past Surgical History:  Procedure Laterality Date   ABDOMINAL HYSTERECTOMY     BIOPSY THYROID  05/2019   CARPAL TUNNEL RELEASE Right 12/21/2020   Procedure: RIGHT CARPAL TUNNEL RELEASE;  Surgeon: Marlyne Beards, MD;  Location: [facility];  Service: Orthopedics;  Laterality: Right;   CESAREAN SECTION     CHOLECYSTECTOMY     HAMMER TOE SURGERY Right    KNEE REPAIR EXTENSOR MECHANISM Right    MOLE REMOVAL     removed from neck, 2023   ORIF ANKLE FRACTURE Left    Family History  Problem Relation Age of Onset   Stroke Mother    Heart disease Mother    Heart disease Sister    Hypertension Sister    Stroke Sister    Heart disease Brother    Hypertension Brother    Heart failure Brother    Breast cancer Neg Hx    Outpatient Medications Prior to Visit  Medication Sig Dispense Refill   acetaminophen (TYLENOL) 500 MG tablet Take 1 tablet by mouth four times daily     amLODipine (NORVASC) 5 MG tablet Take 1 tablet (5 mg total) by mouth daily. 90  tablet 1   apixaban (ELIQUIS) 5 MG TABS tablet Take 1 tablet (5 mg total) by mouth 2 (two) times daily. 180 tablet 1   cholecalciferol (VITAMIN D3) 25 MCG (1000 UNIT) tablet Take 1,000 Units by mouth daily.     fluticasone (FLONASE) 50 MCG/ACT nasal spray Place 1 spray into both nostrils as needed for allergies or rhinitis.     furosemide (LASIX) 20 MG tablet Take 20 mg by mouth as needed for fluid or edema.     hydrochlorothiazide (HYDRODIURIL) 25 MG tablet Take 1 tablet (25 mg total) by mouth daily. 100 tablet 2   ketoconazole (NIZORAL) 2 % cream Apply 1 Application topically daily.     losartan (COZAAR) 100  MG tablet TAKE 1 TABLET BY MOUTH DAILY 90 tablet 2   metoprolol tartrate (LOPRESSOR) 100 MG tablet TAKE 1 TABLET BY MOUTH TWICE  DAILY 180 tablet 3   Multiple Vitamin (MULTIVITAMIN) tablet Take 1 tablet by mouth daily.     oxybutynin (DITROPAN) 5 MG tablet TAKE 1 TABLET BY MOUTH 4 TIMES  DAILY 400 tablet 2   potassium chloride (KLOR-CON) 10 MEQ tablet TAKE 1 TABLET BY MOUTH DAILY 100 tablet 2   simvastatin (ZOCOR) 40 MG tablet TAKE 1 TABLET BY MOUTH AT  BEDTIME 100 tablet 2   vitamin E 1000 UNIT capsule Take 1,000 Units by mouth daily.     No facility-administered medications prior to visit.   Allergies  Allergen Reactions   Nsaids Rash   Objective:   Today's Vitals   02/13/23 1443  BP: 130/84  Pulse: 67  Temp: 98.1 F (36.7 C)  TempSrc: Temporal  SpO2: 97%  Weight: 258 lb (117 kg)  Height: 5\' 2"  (1.575 m)   Body mass index is 47.19 kg/m.   General: Well developed, well nourished. No acute distress. Psych: Alert and oriented. Normal mood and affect, with mild tearfulness.  Health Maintenance Due  Topic Date Due   DTaP/Tdap/Td (1 - Tdap) Never done   DEXA SCAN  Never done   Zoster Vaccines- Shingrix (2 of 2) 11/17/2016     Assessment & Plan:   Problem List Items Addressed This Visit       Cardiovascular and Mediastinum   Atrial fibrillation (HCC)   Stable. Rate controlled. Continue metoprolol tartrate 100 mg bid and apixaban (Eliquis) 5 mg twice daily.      Essential hypertension - Primary   Blood pressure is in adequate control. Continue amlodipine 5 mg daily, losartan 100 mg daily, and HCTZ 25 mg daily.        Other   Grief   Spent time allowing [name] to talk about her husband's death and how she has been dealing with things since then. Her faith has been an important part of helping her along. I feel she is still working through grief in a normal way. I encouraged her to get out when she can.      Hyperlipidemia   Lipids are at goal. Continue  simvastatin 40 mg daily.       Return in about 3 months (around 05/14/2023) for Reassessment.   Loyola Mast, MD

## 2023-02-13 NOTE — Assessment & Plan Note (Signed)
Spent time allowing Kristin Lambert to talk about her husband's death and how she has been dealing with things since then. Her faith has been an important part of helping her along. I feel she is still working through grief in a normal way. I encouraged her to get out when she can.

## 2023-03-14 ENCOUNTER — Ambulatory Visit
Admission: RE | Admit: 2023-03-14 | Discharge: 2023-03-14 | Disposition: A | Discharge status: Home / Self Care | Payer: Medicare Other | Source: Ambulatory Visit | Attending: Family Medicine | Admitting: Family Medicine

## 2023-03-14 DIAGNOSIS — Z13820 Encounter for screening for osteoporosis: Secondary | ICD-10-CM

## 2023-03-14 DIAGNOSIS — N951 Menopausal and female climacteric states: Secondary | ICD-10-CM

## 2023-03-15 ENCOUNTER — Encounter: Payer: Self-pay | Admitting: Family Medicine

## 2023-04-11 DIAGNOSIS — L84 Corns and callosities: Secondary | ICD-10-CM | POA: Diagnosis not present

## 2023-04-11 DIAGNOSIS — M79675 Pain in left toe(s): Secondary | ICD-10-CM | POA: Diagnosis not present

## 2023-04-11 DIAGNOSIS — B351 Tinea unguium: Secondary | ICD-10-CM | POA: Diagnosis not present

## 2023-04-14 DIAGNOSIS — Z1211 Encounter for screening for malignant neoplasm of colon: Secondary | ICD-10-CM | POA: Diagnosis not present

## 2023-04-14 DIAGNOSIS — Z1212 Encounter for screening for malignant neoplasm of rectum: Secondary | ICD-10-CM | POA: Diagnosis not present

## 2023-04-16 ENCOUNTER — Other Ambulatory Visit: Payer: Self-pay | Admitting: Family Medicine

## 2023-04-16 DIAGNOSIS — I10 Essential (primary) hypertension: Secondary | ICD-10-CM

## 2023-04-17 ENCOUNTER — Other Ambulatory Visit: Payer: Self-pay | Admitting: Interventional Cardiology

## 2023-04-17 DIAGNOSIS — I4819 Other persistent atrial fibrillation: Secondary | ICD-10-CM

## 2023-04-17 NOTE — Telephone Encounter (Signed)
 Prescription refill request for Eliquis received. Indication:afib Last office visit:11/24 Scr:1.02  5/24 Age: 73 Weight:117  kg  Prescription refilled

## 2023-04-19 LAB — COLOGUARD
COLOGUARD: NEGATIVE
Cologuard: NEGATIVE

## 2023-04-19 LAB — EXTERNAL GENERIC LAB PROCEDURE: COLOGUARD: NEGATIVE

## 2023-04-23 ENCOUNTER — Other Ambulatory Visit: Payer: Self-pay | Admitting: Family Medicine

## 2023-04-23 DIAGNOSIS — I10 Essential (primary) hypertension: Secondary | ICD-10-CM

## 2023-04-29 DIAGNOSIS — I4891 Unspecified atrial fibrillation: Secondary | ICD-10-CM | POA: Diagnosis not present

## 2023-04-29 DIAGNOSIS — M542 Cervicalgia: Secondary | ICD-10-CM | POA: Diagnosis not present

## 2023-04-29 DIAGNOSIS — M50321 Other cervical disc degeneration at C4-C5 level: Secondary | ICD-10-CM | POA: Diagnosis not present

## 2023-04-29 DIAGNOSIS — S0990XA Unspecified injury of head, initial encounter: Secondary | ICD-10-CM | POA: Diagnosis not present

## 2023-04-29 DIAGNOSIS — I6782 Cerebral ischemia: Secondary | ICD-10-CM | POA: Diagnosis not present

## 2023-04-29 DIAGNOSIS — S0003XA Contusion of scalp, initial encounter: Secondary | ICD-10-CM | POA: Diagnosis not present

## 2023-04-29 DIAGNOSIS — W19XXXA Unspecified fall, initial encounter: Secondary | ICD-10-CM | POA: Diagnosis not present

## 2023-04-29 DIAGNOSIS — R519 Headache, unspecified: Secondary | ICD-10-CM | POA: Diagnosis not present

## 2023-04-29 DIAGNOSIS — Z79899 Other long term (current) drug therapy: Secondary | ICD-10-CM | POA: Diagnosis not present

## 2023-04-29 DIAGNOSIS — Z7901 Long term (current) use of anticoagulants: Secondary | ICD-10-CM | POA: Diagnosis not present

## 2023-04-29 DIAGNOSIS — Z8679 Personal history of other diseases of the circulatory system: Secondary | ICD-10-CM | POA: Diagnosis not present

## 2023-04-29 DIAGNOSIS — S199XXA Unspecified injury of neck, initial encounter: Secondary | ICD-10-CM | POA: Diagnosis not present

## 2023-04-30 ENCOUNTER — Telehealth: Payer: Self-pay

## 2023-04-30 NOTE — Transitions of Care (Post Inpatient/ED Visit) (Signed)
 04/30/2023  Name: Kristin Lambert MRN: [id number] DOB: [birth year]  Today's TOC FU Call Status: Today's TOC FU Call Status:: Successful TOC FU Call Completed TOC FU Call Complete Date: 04/30/23 Patient's Name and Date of Birth confirmed.  Transition Care Management Follow-up Telephone Call Date of Discharge: 04/29/23 Discharge Facility: Other [facility]) Name of Other (Non[facility]) Discharge Facility: [facility] Type of Discharge: Emergency Department Reason for ED Visit: Other: (fall) How have you been since you were released from the hospital?: Better Any questions or concerns?: No  Items Reviewed: Did you receive and understand the discharge instructions provided?: Yes Medications obtained,verified, and reconciled?: Yes (Medications Reviewed) Any new allergies since your discharge?: No Dietary orders reviewed?: NA Do you have support at home?: Yes People in Home [RPT]: child(ren), adult  Medications Reviewed Today: Medications Reviewed Today     Reviewed by Karena Addison, LPN (Licensed Practical Nurse) on 04/30/23 at 1147  Med List Status: <None>   Medication Order Taking? Sig Documenting Provider Last Dose Status Informant  acetaminophen (TYLENOL) 500 MG tablet [id number] No Take 1 tablet by mouth four times daily [provider] Taking Active   amLODipine (NORVASC) 5 MG tablet [id number] No Take 1 tablet (5 mg total) by mouth daily. Perlie Gold, PA-C Taking Active   cholecalciferol (VITAMIN D3) 25 MCG (1000 UNIT) tablet [id number] No Take 1,000 Units by mouth daily. [provider] Taking Active   ELIQUIS 5 MG TABS tablet [id number]  TAKE 1 TABLET BY MOUTH TWICE  DAILY Perlie Gold, PA-C  Active   fluticasone (FLONASE) 50 MCG/ACT nasal spray [id number] No Place 1 spray into both nostrils as needed for allergies or rhinitis. [provider] Taking Active   furosemide (LASIX) 20 MG tablet [id number] No Take 20 mg by mouth as needed for fluid or edema.  [provider] Taking Active   hydrochlorothiazide (HYDRODIURIL) 25 MG tablet [id number] No Take 1 tablet (25 mg total) by mouth daily. Loyola Mast, MD Taking Active   ketoconazole (NIZORAL) 2 % cream [id number] No Apply 1 Application topically daily. [provider] Taking Active   losartan (COZAAR) 100 MG tablet [id number]  TAKE 1 TABLET BY MOUTH DAILY Loyola Mast, MD  Active   metoprolol tartrate (LOPRESSOR) 100 MG tablet [id number]  TAKE 1 TABLET BY MOUTH TWICE  DAILY Loyola Mast, MD  Active   Multiple Vitamin (MULTIVITAMIN) tablet [id number] No Take 1 tablet by mouth daily. [provider] Taking Active   oxybutynin (DITROPAN) 5 MG tablet [id number] No TAKE 1 TABLET BY MOUTH 4 TIMES  DAILY Worthy Rancher B, FNP Taking Active   potassium chloride (KLOR-CON) 10 MEQ tablet [id number] No TAKE 1 TABLET BY MOUTH DAILY Loyola Mast, MD Taking Active   simvastatin (ZOCOR) 40 MG tablet [id number] No TAKE 1 TABLET BY MOUTH AT  BEDTIME Worthy Rancher B, FNP Taking Active   vitamin E 1000 UNIT capsule [id number] No Take 1,000 Units by mouth daily. [provider] Taking Active             Home Care and Equipment/Supplies: Were Home Health Services Ordered?: NA Any new equipment or medical supplies ordered?: NA  Functional Questionnaire: Do you need assistance with bathing/showering or dressing?: No Do you need assistance with meal preparation?: No Do you need assistance with eating?: No Do you have difficulty maintaining continence: No Do you need assistance with getting out of bed/getting out of a chair/moving?: No Do you have difficulty managing or taking your medications?: No  Follow up appointments reviewed: PCP Follow-up appointment confirmed?: No (declined, will call back to schedule) Specialist Hospital Follow-up appointment confirmed?: NA Do you need transportation to your follow-up appointment?: No Do you understand care options if your  condition(s) worsen?: Yes-patient verbalized understanding    SIGNATURE Darrall Ellison, LPN [facility] Nurse Health Advisor Direct Dial [phone]

## 2023-05-02 ENCOUNTER — Encounter: Payer: Self-pay | Admitting: Internal Medicine

## 2023-05-02 ENCOUNTER — Ambulatory Visit (INDEPENDENT_AMBULATORY_CARE_PROVIDER_SITE_OTHER): Admitting: Internal Medicine

## 2023-05-02 VITALS — BP 112/62 | HR 45 | Temp 98.4°F | Ht 62.0 in | Wt 259.2 lb

## 2023-05-02 DIAGNOSIS — T148XXA Other injury of unspecified body region, initial encounter: Secondary | ICD-10-CM | POA: Diagnosis not present

## 2023-05-02 DIAGNOSIS — R251 Tremor, unspecified: Secondary | ICD-10-CM | POA: Diagnosis not present

## 2023-05-02 DIAGNOSIS — W19XXXA Unspecified fall, initial encounter: Secondary | ICD-10-CM

## 2023-05-02 DIAGNOSIS — S065XAA Traumatic subdural hemorrhage with loss of consciousness status unknown, initial encounter: Secondary | ICD-10-CM

## 2023-05-02 NOTE — Progress Notes (Signed)
 [facility] [address] [place] [address] Dept: [phone] Dept Fax: [phone]  Acute Care Office Visit  Subjective:   Kristin Lambert 1950-01-23 05/02/2023  Chief Complaint  Patient presents with   Hospitalization Follow-up    Fall on Monday     HPI: Kristin Lambert a 73 yo F who presents for ER follow up . She was seen on 04/29/23 for a fall on blood thinners.patient reports she was dusting at home, when she went to raise up her duster on the wall, lost her balance and fell backwards striking her head against the door and falling on hardwood floor.  She did not lose consciousness.  She was taken by EMS to the emergency room for evaluation.  CT head and neck imaging did not show any intracranial bleeding or neck injury, but she did sustain a subdural hematoma to posterior parietal region.  She reports today she has noticed some soreness to her neck,back, and tailbone.  She does take Tylenol for the pain.   She lost her husband in October of last year, which has been an adjustment for her.  She is slowly trying to get out and about in public and do some exercise as tolerated.  She has noticed her anxiety fluctuating at times, and she has noticed a tremor in both of her hands only when she becomes very anxious.  The following portions of the patient's history were reviewed and updated as appropriate: past medical history, past surgical history, family history, social history, allergies, medications, and problem list.   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 no other symptoms, on ASA   Past Surgical History:  Procedure Laterality Date   ABDOMINAL HYSTERECTOMY     BIOPSY THYROID  05/2019   CARPAL TUNNEL RELEASE Right 12/21/2020   Procedure: RIGHT CARPAL TUNNEL RELEASE;  Surgeon: Marlyne Beards, MD;  Location: [facility];  Service: Orthopedics;  Laterality: Right;   CESAREAN SECTION     CHOLECYSTECTOMY     HAMMER TOE SURGERY Right    KNEE REPAIR EXTENSOR MECHANISM Right    MOLE REMOVAL     removed from neck, 2023   ORIF ANKLE FRACTURE Left    Family History  Problem Relation Age of Onset   Stroke Mother    Heart disease Mother    Heart disease Sister    Hypertension Sister    Stroke Sister    Heart disease Brother    Hypertension Brother    Heart failure Brother    Breast cancer Neg Hx     Current Outpatient Medications:    acetaminophen (TYLENOL) 500 MG tablet, Take 1 tablet by mouth four times  daily, Disp: , Rfl:    amLODipine (NORVASC) 5 MG tablet, Take 1 tablet (5 mg total) by mouth daily., Disp: 90 tablet, Rfl: 1   cholecalciferol (VITAMIN D3) 25 MCG (1000 UNIT) tablet, Take 1,000 Units by mouth daily., Disp: , Rfl:    ELIQUIS 5 MG TABS tablet, TAKE 1 TABLET BY MOUTH TWICE  DAILY, Disp: 200 tablet, Rfl: 2   fluticasone (FLONASE) 50 MCG/ACT nasal spray, Place 1 spray into both nostrils as needed for allergies or rhinitis., Disp: , Rfl:    furosemide (LASIX) 20 MG tablet, Take 20 mg by mouth as needed for fluid or edema., Disp: , Rfl:    hydrochlorothiazide (HYDRODIURIL) 25 MG tablet, Take  1 tablet (25 mg total) by mouth daily., Disp: 100 tablet, Rfl: 2   ketoconazole (NIZORAL) 2 % cream, Apply 1 Application topically daily., Disp: , Rfl:    losartan (COZAAR) 100 MG tablet, TAKE 1 TABLET BY MOUTH DAILY, Disp: 90 tablet, Rfl: 3   metoprolol tartrate (LOPRESSOR) 100 MG tablet, TAKE 1 TABLET BY MOUTH TWICE  DAILY, Disp: 200 tablet, Rfl: 2   Multiple Vitamin (MULTIVITAMIN) tablet, Take 1 tablet by mouth daily., Disp: , Rfl:    oxybutynin (DITROPAN) 5 MG tablet, TAKE 1 TABLET BY MOUTH 4 TIMES  DAILY, Disp: 400 tablet, Rfl: 2   potassium chloride (KLOR-CON) 10 MEQ tablet, TAKE 1 TABLET BY MOUTH DAILY, Disp: 100 tablet, Rfl: 2   simvastatin (ZOCOR) 40 MG tablet, TAKE 1 TABLET BY MOUTH AT  BEDTIME, Disp: 100 tablet, Rfl: 2   vitamin E 1000 UNIT capsule, Take 1,000 Units by mouth daily., Disp: , Rfl:  Allergies  Allergen Reactions   Nsaids Rash     ROS: A complete ROS was performed with pertinent positives/negatives noted in the HPI. The remainder of the ROS are negative.    Objective:   Today's Vitals   05/02/23 1424  BP: 112/62  Pulse: (!) 45  Temp: 98.4 F (36.9 C)  TempSrc: Temporal  SpO2: 97%  Weight: 259 lb 3.2 oz (117.6 kg)  Height: 5\' 2"  (1.575 m)    GENERAL: Well-appearing, in NAD. Well nourished.  SKIN: Pink, warm and dry.  Large hematoma to posterior parietal region of head.  Ecchymosis to lateral left upper arm, left upper back, and right forearm NECK: Trachea midline. Full ROM w/o pain or tenderness. No lymphadenopathy.  RESPIRATORY: Chest wall symmetrical. Respirations even and non-labored. Breath sounds clear to auscultation bilaterally.  CARDIAC: S1, S2 present, regular rate and irregular rhythm. Peripheral pulses 2+ bilaterally.  MSK: Muscle tone and strength appropriate for age. EXTREMITIES: Without clubbing, cyanosis, or edema.  NEUROLOGIC: Steady gait with rollator assistance. PSYCH/MENTAL STATUS: Alert, oriented x 3. Cooperative, appropriate mood and  affect.    No results found for any visits on 05/02/23.    Assessment & Plan:  1. Fall, initial encounter (Primary)  2. Subdural hematoma (HCC) - Advised patient this can take several weeks to fully resolve  3. Bruising -Advised patient can she can take Tylenol as needed for aches and pains  4. Tremor of both hands -Likely contributed from anxiety  No orders of the defined types were placed in this encounter.  No orders of the defined types were placed in this encounter.  Lab Orders  No laboratory test(s) ordered today   No images are attached to the encounter or orders placed in the encounter.  Return if symptoms worsen or fail to improve.   Salvatore Decent, FNP

## 2023-06-19 ENCOUNTER — Ambulatory Visit (INDEPENDENT_AMBULATORY_CARE_PROVIDER_SITE_OTHER): Payer: Medicare Other | Admitting: Family Medicine

## 2023-06-19 ENCOUNTER — Encounter: Payer: Self-pay | Admitting: Family Medicine

## 2023-06-19 VITALS — BP 126/70 | HR 65 | Temp 97.6°F | Ht 62.0 in | Wt 259.8 lb

## 2023-06-19 DIAGNOSIS — I6523 Occlusion and stenosis of bilateral carotid arteries: Secondary | ICD-10-CM

## 2023-06-19 DIAGNOSIS — I10 Essential (primary) hypertension: Secondary | ICD-10-CM

## 2023-06-19 DIAGNOSIS — I4819 Other persistent atrial fibrillation: Secondary | ICD-10-CM | POA: Diagnosis not present

## 2023-06-19 DIAGNOSIS — E042 Nontoxic multinodular goiter: Secondary | ICD-10-CM

## 2023-06-19 DIAGNOSIS — E782 Mixed hyperlipidemia: Secondary | ICD-10-CM

## 2023-06-19 NOTE — Progress Notes (Signed)
 [facility] [address] [place] [address] Dept: [phone] Dept Fax: [phone]  Chronic Care Office Visit  Subjective:    Patient ID: Kristin Lambert, female    DOB: 19-Jul-1950, 73 y.o..   MRN: [id number]  Chief Complaint  Patient presents with   Hypertension    3 month f/u HTN.  No concerns.     History of Present Illness:  Patient is in today for reassessment of chronic medical issues.  [name] has a history of hypertension, managed with amlodipine  5 mg daily, losartan  100 mg daily, and HCTZ 25 mg daily. She is also on some occasional Lasix  related to pedal edema and takes a potassium supplement.   [name] has a history of atrial fibrillation. She is followed by Dr. Jacquelynn Matter (cardiology). Currently, her rate has been well controlled. [name] is on metoprolol  tartrate 100 mg bid. She is also on apixaban  (Eliquis ) 5 mg twice daily for anticoagulation.   [name] has a history of hyperlipidemia and is managed on simvastatin  40 mg daily.   [name] had a fall at home in April. Based on her description, she may have had a temporary drop in her blood pressure while exerting herself. She has not sign of a significant trauma and has not had recurrence of this issue.  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 10/18/2020   Multiple thyroid  nodules 10/18/2020   Morbid obesity with BMI of 45.0-49.9, adult (HCC) 11/13/2019   Vitamin D deficiency 07/30/2019   Hyperlipidemia 07/30/2019   Osteoarthritis of knees, bilateral 06/19/2019   Essential hypertension  06/19/2019   Past Surgical History:  Procedure Laterality Date   ABDOMINAL HYSTERECTOMY     BIOPSY THYROID   05/2019   CARPAL TUNNEL RELEASE Right 12/21/2020   Procedure: RIGHT CARPAL TUNNEL RELEASE;  Surgeon: Marilyn Shropshire, MD;  Location: [facility];  Service: Orthopedics;  Laterality: Right;   CESAREAN SECTION     CHOLECYSTECTOMY     HAMMER TOE SURGERY Right    KNEE REPAIR EXTENSOR MECHANISM Right    MOLE REMOVAL     removed from neck, 2023   ORIF ANKLE FRACTURE Left    Family History  Problem Relation Age of Onset   Stroke Mother    Heart disease Mother    Heart disease Sister    Hypertension Sister    Stroke Sister    Heart disease Brother    Hypertension Brother    Heart failure Brother    Breast cancer Neg Hx    Outpatient Medications Prior to Visit  Medication Sig Dispense Refill   acetaminophen  (TYLENOL ) 500 MG tablet Take 1 tablet by mouth four times daily     amLODipine  (NORVASC ) 5 MG tablet Take 1 tablet (5 mg total) by mouth daily. 90 tablet 1   ELIQUIS  5 MG TABS tablet TAKE 1 TABLET BY MOUTH TWICE  DAILY 200 tablet 2   fluticasone  (FLONASE ) 50 MCG/ACT nasal spray Place 1 spray into both nostrils as needed for allergies or rhinitis.     furosemide  (LASIX ) 20 MG tablet Take 20 mg by mouth as needed for fluid or  edema.     hydrochlorothiazide  (HYDRODIURIL ) 25 MG tablet Take 1 tablet (25 mg total) by mouth daily. 100 tablet 2   ketoconazole (NIZORAL) 2 % cream Apply 1 Application topically daily.     losartan  (COZAAR ) 100 MG tablet TAKE 1 TABLET BY MOUTH DAILY 90 tablet 3   metoprolol  tartrate (LOPRESSOR ) 100 MG tablet TAKE 1 TABLET BY MOUTH TWICE  DAILY 200 tablet 2   Multiple Vitamin (MULTIVITAMIN) tablet Take 1 tablet by mouth daily.     oxybutynin  (DITROPAN ) 5 MG tablet TAKE 1 TABLET BY MOUTH 4 TIMES  DAILY 400 tablet 2   potassium chloride  (KLOR-CON ) 10 MEQ tablet TAKE 1 TABLET BY MOUTH DAILY 100 tablet 2   simvastatin  (ZOCOR ) 40 MG tablet  TAKE 1 TABLET BY MOUTH AT  BEDTIME 100 tablet 2   cholecalciferol (VITAMIN D3) 25 MCG (1000 UNIT) tablet Take 1,000 Units by mouth daily. (Patient not taking: Reported on 06/19/2023)     vitamin E 1000 UNIT capsule Take 1,000 Units by mouth daily. (Patient not taking: Reported on 06/19/2023)     No facility-administered medications prior to visit.   Allergies  Allergen Reactions   Nsaids Rash   Objective:   Today's Vitals   06/19/23 1455  BP: 126/70  Pulse: 65  Temp: 97.6 F (36.4 C)  TempSrc: Temporal  SpO2: 95%  Weight: 259 lb 12.8 oz (117.8 kg)  Height: 5\' 2"  (1.575 m)   Body mass index is 47.52 kg/m.   General: Well developed, well nourished. No acute distress. Psych: Alert and oriented. Normal mood and affect.  Health Maintenance Due  Topic Date Due   DTaP/Tdap/Td (1 - Tdap) Never done   Medicare Annual Wellness (AWV)  07/20/2023     Assessment & Plan:   Problem List Items Addressed This Visit       Cardiovascular and Mediastinum   Atrial fibrillation (HCC)   Stable. Rate controlled. Continue metoprolol  tartrate 100 mg bid and apixaban  (Eliquis ) 5 mg twice daily.      Carotid stenosis, asymptomatic, bilateral   Due for annual follow-up of carotid stenosis.      Relevant Orders   US  Carotid Duplex Bilateral   Essential hypertension - Primary   Blood pressure is in good control. Continue amlodipine  5 mg daily, losartan  100 mg daily, and HCTZ 25 mg daily.        Endocrine   Multiple thyroid  nodules   Thyroid  nodules have been gradually progressive. Prior biopsies of both nodules showed benign tissue. Plan to continue annual assessment, due now.      Relevant Orders   US  THYROID      Other   Hyperlipidemia   Lipids are at goal. Continue simvastatin  40 mg daily.       Return in about 3 months (around 09/19/2023) for Reassessment.   Graig Lawyer, MD

## 2023-06-19 NOTE — Assessment & Plan Note (Signed)
 Due for annual follow-up of carotid stenosis.

## 2023-06-19 NOTE — Assessment & Plan Note (Signed)
 Lipids are at goal. Continue simvastatin 40 mg daily.

## 2023-06-19 NOTE — Assessment & Plan Note (Signed)
 Stable. Rate controlled. Continue metoprolol tartrate 100 mg bid and apixaban (Eliquis) 5 mg twice daily.

## 2023-06-19 NOTE — Assessment & Plan Note (Signed)
 Thyroid  nodules have been gradually progressive. Prior biopsies of both nodules showed benign tissue. Plan to continue annual assessment, due now.

## 2023-06-19 NOTE — Assessment & Plan Note (Signed)
 Blood pressure is in good control. Continue amlodipine  5 mg daily, losartan  100 mg daily, and HCTZ 25 mg daily.

## 2023-06-25 ENCOUNTER — Other Ambulatory Visit: Payer: Self-pay | Admitting: Family Medicine

## 2023-06-27 NOTE — Progress Notes (Unsigned)
 Cardiology Office Note    Date:  06/29/2023  ID:  Kristin Lambert, DOB [birth year], MRN [id number] PCP:  Graig Lawyer, MD  Cardiologist:  Avery Bodo, MD  Electrophysiologist:  None   Chief Complaint: Follow up for atrial fibrillation   History of Present Illness: .    Kristin Lambert is a 73 y.o. female with visit-pertinent history of atrial fibrillation, hypertension and intermittent pedal edema.  Patient previously diagnosed with atrial fibrillation during a preoperative examination, also noted to have carotid artery disease with carotid artery duplex in 2023 showing total occlusion of the right ICA, reportedly with collateral circulation with no indication for revascularization.  Patient was last seen in clinic on 12/10/2022 by Leala Prince, PA.  It was noted that she had overall remained stable from a cardiac standpoint.  She was noted to be hypertensive and her amlodipine  was increased.  Patient remained in atrial fibrillation, discussion regarding cardioversion resulted in patient electing to continue in atrial fibrillation.  She is only taking Lasix  about once a week where her ankles were swollen particularly on the left side instead of an old fracture.  Today she presents for follow-up with her daughter.  She reports that she is doing well overall.  She denies any chest pain, increased shortness of breath, increased lower extremity edema, orthopnea or PND.  She notes occasional palpitations however notes these are overall well-controlled, she is able to stop her activity take of breath and they improved.  She denies any dizziness, lightheadedness, presyncope or syncope.  Patient notes that she did have a fall earlier in the year, reports she was dusting and simply fell backwards after becoming off balance, denies any presyncope or syncope.  She denies any loss of consciousness, she was evaluated at [facility] with reassuring workup.  She notes that her husband passed away this past 11/13/23 and  she has been adjusting.  She has started going back to church and has been going and walking in the parking lot at [employer] and walking around [employer] garden center with her walker.  Labwork independently reviewed: 04/29/23: Hemoglobin 13.4, hematocrit 39.5, sodium 138, potassium 3.9, creatinine 1.02, AST 13, ALT 9 ROS: .   Today she denies chest pain, shortness of breath, lower extremity edema, fatigue, melena, hematuria, hemoptysis, diaphoresis, weakness, presyncope, syncope, orthopnea, and PND.  All other systems are reviewed and otherwise negative. Studies Reviewed: Aaron Aas   EKG:  EKG is ordered today, personally reviewed, demonstrating  EKG Interpretation Date/Time:  Friday June 28 2023 15:49:44 EDT Ventricular Rate:  66 PR Interval:    QRS Duration:  92 QT Interval:  420 QTC Calculation: 440 R Axis:   4  Text Interpretation: Atrial fibrillation When compared with ECG of 10-Dec-2022 14:37, No significant change was found Confirmed by Roxi Hlavaty [phone]) on 06/29/2023 7:17:54 PM   CV Studies: Cardiac studies reviewed are outlined and summarized above. Otherwise please see EMR for full report. Cardiac Studies & Procedures   ______________________________________________________________________________________________     ECHOCARDIOGRAM  ECHOCARDIOGRAM COMPLETE 03/01/2021  Narrative ECHOCARDIOGRAM REPORT    Patient Name:   Kristin Lambert   Date of Exam: 03/01/2021 Medical Rec #:  [id number]     Height:       62.0 in Accession #:    [id number]    Weight:       239.0 lb Date of Birth:  [birth year]     BSA:          2.062 m Patient Age:    20  years      BP:           128/74 mmHg Patient Gender: F             HR:           59 bpm. Exam Location:  [address]  Procedure: 2D Echo, Cardiac Doppler and Color Doppler  Indications:    I48.91* Unspecified atrial fibrillation  History:        Patient has no prior history of Echocardiogram examinations. Risk Factors:Hypertension and  Dyslipidemia. Morbid obesity. Pedal edema. Pulmonary embolus.  Sonographer:    Mylinda Asa RCS Referring Phys: [id number] Anner Kill VARANASI  IMPRESSIONS   1. Left ventricular ejection fraction, by estimation, is 55 to 60%. The left ventricle has normal function. The left ventricle has no regional wall motion abnormalities. Left ventricular diastolic parameters are indeterminate. 2. Right ventricular systolic function is low normal. The right ventricular size is normal. 3. Left atrial size was mildly dilated. 4. The mitral valve is normal in structure. Trivial mitral valve regurgitation. 5. The aortic valve is tricuspid. Aortic valve regurgitation is not visualized. Aortic valve sclerosis is present, with no evidence of aortic valve stenosis. 6. The inferior vena cava is normal in size with greater than 50% respiratory variability, suggesting right atrial pressure of 3 mmHg.  FINDINGS Left Ventricle: Left ventricular ejection fraction, by estimation, is 55 to 60%. The left ventricle has normal function. The left ventricle has no regional wall motion abnormalities. The left ventricular internal cavity size was normal in size. There is no left ventricular hypertrophy. Left ventricular diastolic parameters are indeterminate.  Right Ventricle: The right ventricular size is normal. Right vetricular wall thickness was not assessed. Right ventricular systolic function is low normal.  Left Atrium: Left atrial size was mildly dilated.  Right Atrium: Right atrial size was normal in size.  Pericardium: There is no evidence of pericardial effusion.  Mitral Valve: The mitral valve is normal in structure. Trivial mitral valve regurgitation.  Tricuspid Valve: The tricuspid valve is normal in structure. Tricuspid valve regurgitation is mild.  Aortic Valve: The aortic valve is tricuspid. Aortic valve regurgitation is not visualized. Aortic valve sclerosis is present, with no evidence of aortic valve  stenosis.  Pulmonic Valve: The pulmonic valve was normal in structure. Pulmonic valve regurgitation is mild.  Aorta: The aortic root is normal in size and structure.  Venous: The inferior vena cava is normal in size with greater than 50% respiratory variability, suggesting right atrial pressure of 3 mmHg.  IAS/Shunts: No atrial level shunt detected by color flow Doppler.   LEFT VENTRICLE PLAX 2D LVIDd:         4.80 cm   Diastology LVIDs:         3.20 cm   LV e' lateral: 16.00 cm/s LV PW:         1.00 cm LV IVS:        0.90 cm LVOT diam:     2.10 cm LV SV:         86 LV SV Index:   41 LVOT Area:     3.46 cm   RIGHT VENTRICLE RV S prime:     10.43 cm/s TAPSE (M-mode): 1.8 cm RVSP:           45.5 mmHg  LEFT ATRIUM             Index        RIGHT ATRIUM  Index LA diam:        4.70 cm 2.28 cm/m   RA Pressure: 8.00 mmHg LA Vol (A2C):   74.1 ml 35.94 ml/m  RA Area:     15.30 cm LA Vol (A4C):   69.9 ml 33.90 ml/m  RA Volume:   31.60 ml  15.32 ml/m LA Biplane Vol: 74.6 ml 36.18 ml/m AORTIC VALVE LVOT Vmax:   102.27 cm/s LVOT Vmean:  62.833 cm/s LVOT VTI:    0.247 m  AORTA Ao Root diam: 3.00 cm Ao Asc diam:  2.70 cm  TRICUSPID VALVE TR Peak grad:   37.5 mmHg TR Vmax:        306.00 cm/s Estimated RAP:  8.00 mmHg RVSP:           45.5 mmHg  SHUNTS Systemic VTI:  0.25 m Systemic Diam: 2.10 cm  Ola Berger MD Electronically signed by Ola Berger MD Signature Date/Time: 03/01/2021/3:23:29 PM    Final          ______________________________________________________________________________________________       Current Reported Medications:.    Current Meds  Medication Sig   acetaminophen  (TYLENOL ) 500 MG tablet Take 1 tablet by mouth four times daily   amLODipine  (NORVASC ) 5 MG tablet Take 1 tablet (5 mg total) by mouth daily.   cholecalciferol (VITAMIN D3) 25 MCG (1000 UNIT) tablet Take 1,000 Units by mouth daily.   ELIQUIS  5 MG TABS tablet TAKE 1  TABLET BY MOUTH TWICE  DAILY   fluticasone  (FLONASE ) 50 MCG/ACT nasal spray Place 1 spray into both nostrils as needed for allergies or rhinitis.   furosemide  (LASIX ) 20 MG tablet Take 20 mg by mouth as needed for fluid or edema.   hydrochlorothiazide  (HYDRODIURIL ) 25 MG tablet Take 1 tablet (25 mg total) by mouth daily.   ketoconazole (NIZORAL) 2 % cream Apply 1 Application topically daily.   losartan  (COZAAR ) 100 MG tablet TAKE 1 TABLET BY MOUTH DAILY   metoprolol  tartrate (LOPRESSOR ) 100 MG tablet TAKE 1 TABLET BY MOUTH TWICE  DAILY   Multiple Vitamin (MULTIVITAMIN) tablet Take 1 tablet by mouth daily.   oxybutynin  (DITROPAN ) 5 MG tablet TAKE 1 TABLET BY MOUTH 4 TIMES  DAILY   potassium chloride  (KLOR-CON ) 10 MEQ tablet TAKE 1 TABLET BY MOUTH DAILY   simvastatin  (ZOCOR ) 40 MG tablet TAKE 1 TABLET BY MOUTH AT  BEDTIME   vitamin E 1000 UNIT capsule Take 1,000 Units by mouth daily.    Physical Exam:    VS:  BP 136/84   Pulse (!) 59   Ht 5' 2 (1.575 m)   Wt 260 lb (117.9 kg)   SpO2 97%   BMI 47.55 kg/m    Wt Readings from Last 3 Encounters:  06/28/23 260 lb (117.9 kg)  06/19/23 259 lb 12.8 oz (117.8 kg)  05/02/23 259 lb 3.2 oz (117.6 kg)    GEN: Well nourished, well developed in no acute distress NECK: No JVD; No carotid bruits CARDIAC: IRIR, no murmurs, rubs, gallops RESPIRATORY:  Clear to auscultation without rales, wheezing or rhonchi  ABDOMEN: Soft, non-tender, non-distended EXTREMITIES:  No edema; No acute deformity     Asessement and Plan:.    Atrial fibrillation: Patient with chronic atrial fibrillation.  EKG today indicates atrial fibrillation, no significant change compared to prior.  She reports occasional palpitations when she overexerts herself, quickly resolved with rest.  She denies any sustained palpitations, dizziness, lightheadedness, presyncope or syncope.  She denies any chest pain or increased shortness of  breath.  She denies any bleeding problems. She is not  interested in rhythm control strategies. Continue metoprolol  tartrate 100 mg twice daily and Eliquis  5 mg twice daily  Hypertension: Blood pressure today 146/88, on recheck was 136/84.  Continue current antihypertensive regimen.  Carotid artery disease: Total occlusion of the right internal carotid artery noted on 2023 carotid artery duplex, prior notes report collateral circulation and asymptomatic. Patient has annual carotid artery duplex from PCP. Continue Eliquis  and simvastatin .  Hyperlipidemia: Direct LDL 60 on 12/10/2022.  Continue simvastatin .    Disposition: F/u with Dr. Renna Cary in six months to establish care.   Signed, Glenden Rossell D Clayborn Milnes, NP

## 2023-06-28 ENCOUNTER — Encounter: Payer: Self-pay | Admitting: Cardiology

## 2023-06-28 ENCOUNTER — Ambulatory Visit: Attending: Cardiology | Admitting: Cardiology

## 2023-06-28 VITALS — BP 136/84 | HR 59 | Ht 62.0 in | Wt 260.0 lb

## 2023-06-28 DIAGNOSIS — I10 Essential (primary) hypertension: Secondary | ICD-10-CM | POA: Diagnosis not present

## 2023-06-28 DIAGNOSIS — E782 Mixed hyperlipidemia: Secondary | ICD-10-CM

## 2023-06-28 DIAGNOSIS — R600 Localized edema: Secondary | ICD-10-CM

## 2023-06-28 DIAGNOSIS — I6523 Occlusion and stenosis of bilateral carotid arteries: Secondary | ICD-10-CM

## 2023-06-28 DIAGNOSIS — I4819 Other persistent atrial fibrillation: Secondary | ICD-10-CM

## 2023-06-28 NOTE — Patient Instructions (Signed)
 Medication Instructions:  No changes *If you need a refill on your cardiac medications before your next appointment, please call your pharmacy*  Lab Work: No labs If you have labs (blood work) drawn today and your tests are completely normal, you will receive your results only by: MyChart Message (if you have MyChart) OR A paper copy in the mail If you have any lab test that is abnormal or we need to change your treatment, we will call you to review the results.  Testing/Procedures: No testing  Follow-Up: At [facility], you and your health needs are our priority.  As part of our continuing mission to provide you with exceptional heart care, our providers are all part of one team.  This team includes your primary Cardiologist (physician) and Advanced Practice Providers or APPs (Physician Assistants and Nurse Practitioners) who all work together to provide you with the care you need, when you need it.  Your next appointment:   6 month(s)  Provider:   Dorothye Gathers, MD  We recommend signing up for the patient portal called MyChart.  Sign up information is provided on this After Visit Summary.  MyChart is used to connect with patients for Virtual Visits (Telemedicine).  Patients are able to view lab/test results, encounter notes, upcoming appointments, etc.  Non-urgent messages can be sent to your provider as well.   To learn more about what you can do with MyChart, go to [email or web address].

## 2023-06-29 ENCOUNTER — Encounter: Payer: Self-pay | Admitting: Cardiology

## 2023-07-17 DIAGNOSIS — M79675 Pain in left toe(s): Secondary | ICD-10-CM | POA: Diagnosis not present

## 2023-07-17 DIAGNOSIS — L84 Corns and callosities: Secondary | ICD-10-CM | POA: Diagnosis not present

## 2023-07-17 DIAGNOSIS — B351 Tinea unguium: Secondary | ICD-10-CM | POA: Diagnosis not present

## 2023-08-01 ENCOUNTER — Ambulatory Visit
Admission: RE | Admit: 2023-08-01 | Discharge: 2023-08-01 | Disposition: A | Discharge status: Home / Self Care | Source: Ambulatory Visit | Attending: Family Medicine

## 2023-08-01 ENCOUNTER — Ambulatory Visit
Admission: RE | Admit: 2023-08-01 | Discharge: 2023-08-01 | Disposition: A | Discharge status: Home / Self Care | Source: Ambulatory Visit | Attending: Family Medicine | Admitting: Family Medicine

## 2023-08-01 DIAGNOSIS — E042 Nontoxic multinodular goiter: Secondary | ICD-10-CM

## 2023-08-01 DIAGNOSIS — I6521 Occlusion and stenosis of right carotid artery: Secondary | ICD-10-CM | POA: Diagnosis not present

## 2023-08-01 DIAGNOSIS — I6523 Occlusion and stenosis of bilateral carotid arteries: Secondary | ICD-10-CM

## 2023-08-08 ENCOUNTER — Ambulatory Visit: Payer: Self-pay | Admitting: Family Medicine

## 2023-08-08 DIAGNOSIS — E042 Nontoxic multinodular goiter: Secondary | ICD-10-CM

## 2023-09-01 ENCOUNTER — Other Ambulatory Visit: Payer: Self-pay | Admitting: Cardiology

## 2023-09-09 ENCOUNTER — Ambulatory Visit: Payer: Self-pay
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
## 2023-09-10 NOTE — Telephone Encounter (Signed)
 Was able to move her to 09/11/23 appointment due to an cancellation. Dm/cma

## 2023-09-11 ENCOUNTER — Other Ambulatory Visit: Payer: Self-pay | Admitting: Family Medicine

## 2023-09-11 ENCOUNTER — Ambulatory Visit (INDEPENDENT_AMBULATORY_CARE_PROVIDER_SITE_OTHER): Admitting: Family Medicine

## 2023-09-11 ENCOUNTER — Other Ambulatory Visit: Payer: Self-pay | Admitting: Family

## 2023-09-11 ENCOUNTER — Encounter: Payer: Self-pay | Admitting: Family Medicine

## 2023-09-11 VITALS — BP 120/76 | HR 73 | Temp 97.4°F | Ht 62.0 in | Wt 264.6 lb

## 2023-09-11 DIAGNOSIS — T07XXXA Unspecified multiple injuries, initial encounter: Secondary | ICD-10-CM | POA: Diagnosis not present

## 2023-09-11 DIAGNOSIS — N393 Stress incontinence (female) (male): Secondary | ICD-10-CM

## 2023-09-11 DIAGNOSIS — M170 Bilateral primary osteoarthritis of knee: Secondary | ICD-10-CM
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
## 2023-09-11 NOTE — Assessment & Plan Note (Signed)
 Requests upright walker, as the wheeled walker causes her to have to bend forward too far.

## 2023-09-13 ENCOUNTER — Ambulatory Visit: Admitting: Family Medicine

## 2023-09-20 ENCOUNTER — Ambulatory Visit: Admitting: Family Medicine

## 2023-09-26 ENCOUNTER — Other Ambulatory Visit: Payer: Self-pay | Admitting: Family Medicine

## 2023-09-26 NOTE — Telephone Encounter (Signed)
 Copied from CRM #[id number]. Topic: Clinical - Medication Refill >> Sep 26, 2023  1:41 PM Rosina D wrote: Medication: simvastatin  (ZOCOR ) 40 MG tablet, oxybutynin   Fluticasone (patient need three month supply and the patient stated the pharmacy has reached out)  Has the patient contacted their pharmacy? Yes (Agent: If no, request that the patient contact the pharmacy for the refill. If patient does not wish to contact the pharmacy document the reason why and proceed with request.) (Agent: If yes, when and what did the pharmacy advise?)  This is the patient's preferred pharmacy:  [facility] - [place], [place] - [address] [facility] [address] [place] [address] [place] [address] Phone: [phone] Fax: [phone]  Is this the correct pharmacy for this prescription? Yes If no, delete pharmacy and type the correct one.   Has the prescription been filled recently? No  Is the patient out of the medication? No  Has the patient been seen for an appointment in the last year OR does the patient have an upcoming appointment? Yes  Can we respond through MyChart? Yes  Agent: Please be advised that Rx refills may take up to 3 business days. We ask that you follow-up with your pharmacy.

## 2023-10-08 ENCOUNTER — Telehealth: Payer: Self-pay

## 2023-10-08 DIAGNOSIS — T07XXXA Unspecified multiple injuries, initial encounter: Secondary | ICD-10-CM

## 2023-10-08 MED ORDER — METHOCARBAMOL 500 MG PO TABS
500.0000 mg | ORAL_TABLET | Freq: Four times a day (QID) | ORAL | 0 refills | Status: AC
Start: 2023-10-08 — End: ?

## 2023-10-08 NOTE — Telephone Encounter (Signed)
Please review and advise. Thanks. Dm/cma  
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
## 2023-10-08 NOTE — Addendum Note (Signed)
 Addended by: THEDORA GARNETTE HERO on: 10/08/2023 06:07 PM   Modules accepted: Orders

## 2023-11-01 ENCOUNTER — Other Ambulatory Visit: Payer: Self-pay | Admitting: Family Medicine

## 2023-11-01 DIAGNOSIS — Z1231 Encounter for screening mammogram for malignant neoplasm of breast: Secondary | ICD-10-CM

## 2023-11-06 ENCOUNTER — Telehealth: Payer: Self-pay

## 2023-11-06 DIAGNOSIS — N393 Stress incontinence (female) (male): Secondary | ICD-10-CM

## 2023-11-06 MED ORDER — OXYBUTYNIN CHLORIDE 5 MG PO TABS: 5.0000 mg | ORAL_TABLET | Freq: Four times a day (QID) | ORAL | 3 refills | Status: DC

## 2023-11-06 NOTE — Telephone Encounter (Signed)
 Refill request for Oxybutynin  5 mg tabs. Dm/cma

## 2023-12-11 ENCOUNTER — Telehealth: Payer: Self-pay

## 2023-12-11 MED ORDER — SIMVASTATIN 40 MG PO TABS: 40.0000 mg | ORAL_TABLET | Freq: Every day | ORAL | 2 refills | Status: DC

## 2023-12-11 NOTE — Telephone Encounter (Signed)
 Refill request for Simvastatin  Dm/cma

## 2023-12-18 ENCOUNTER — Encounter: Payer: Self-pay | Admitting: Family Medicine

## 2023-12-18 ENCOUNTER — Ambulatory Visit: Admitting: Family Medicine

## 2023-12-18 VITALS — BP 132/84 | HR 77 | Temp 97.9°F | Ht 62.0 in | Wt 261.6 lb

## 2023-12-18 DIAGNOSIS — E782 Mixed hyperlipidemia: Secondary | ICD-10-CM

## 2023-12-18 DIAGNOSIS — R251 Tremor, unspecified: Secondary | ICD-10-CM

## 2023-12-18 DIAGNOSIS — I10 Essential (primary) hypertension: Secondary | ICD-10-CM | POA: Diagnosis not present

## 2023-12-18 DIAGNOSIS — E042 Nontoxic multinodular goiter: Secondary | ICD-10-CM

## 2023-12-18 DIAGNOSIS — Z23 Encounter for immunization: Secondary | ICD-10-CM

## 2023-12-18 DIAGNOSIS — I4891 Unspecified atrial fibrillation: Secondary | ICD-10-CM | POA: Diagnosis not present

## 2023-12-18 DIAGNOSIS — R209 Unspecified disturbances of skin sensation: Secondary | ICD-10-CM | POA: Diagnosis not present

## 2023-12-18 DIAGNOSIS — Z6841 Body Mass Index (BMI) 40.0 and over, adult: Secondary | ICD-10-CM

## 2023-12-18 DIAGNOSIS — I4819 Other persistent atrial fibrillation: Secondary | ICD-10-CM
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
## 2023-12-18 NOTE — Assessment & Plan Note (Addendum)
 I will reassess lipids today. Continue simvastatin  40 mg daily.

## 2023-12-18 NOTE — Assessment & Plan Note (Signed)
 Stable. Rate controlled. Continue metoprolol tartrate 100 mg bid and apixaban (Eliquis) 5 mg twice daily.

## 2023-12-18 NOTE — Assessment & Plan Note (Signed)
 Kristin Lambert notes she has made improvements in her diet, following previous Weight [employer] approaches. She is limited in her physical activity due to knee arthritis and orthostasis.

## 2023-12-18 NOTE — Assessment & Plan Note (Signed)
 Blood pressure is in good control. Continue amlodipine  5 mg daily, losartan  100 mg daily, and HCTZ 25 mg daily.

## 2023-12-18 NOTE — Assessment & Plan Note (Deleted)
 Due for annual follow-up of carotid stenosis.

## 2023-12-18 NOTE — Assessment & Plan Note (Addendum)
 Thyroid  nodules have been gradually progressive. Recent nodules do show two that are gradually increasing in size and considered suspicious. I had previously reached out to Kristin Lambert regarding her having a biopsy of these. She has declined to proceed with this.

## 2023-12-19 ENCOUNTER — Ambulatory Visit: Payer: Self-pay | Admitting: Family Medicine

## 2023-12-19 LAB — CBC
HCT: 38.8 % (ref 36.0–46.0)
Hemoglobin: 12.6 g/dL (ref 12.0–15.0)
MCHC: 32.6 g/dL (ref 30.0–36.0)
MCV: 88.6 fl (ref 78.0–100.0)
Platelets: 274 K/uL (ref 150.0–400.0)
RBC: 4.38 Mil/uL (ref 3.87–5.11)
RDW: 14.2 % (ref 11.5–15.5)
WBC: 8.8 K/uL (ref 4.0–10.5)

## 2023-12-19 LAB — COMPREHENSIVE METABOLIC PANEL WITH GFR
ALT: 12 U/L (ref 0–35)
AST: 19 U/L (ref 0–37)
Albumin: 4.5 g/dL (ref 3.5–5.2)
Alkaline Phosphatase: 73 U/L (ref 39–117)
BUN: 22 mg/dL (ref 6–23)
CO2: 28 meq/L (ref 19–32)
Calcium: 9.7 mg/dL (ref 8.4–10.5)
Chloride: 103 meq/L (ref 96–112)
Creatinine, Ser: 0.91 mg/dL (ref 0.40–1.20)
GFR: 62.26 mL/min (ref >60.00)
Glucose, Bld: 99 mg/dL (ref 70–99)
Potassium: 4.4 meq/L (ref 3.5–5.1)
Sodium: 140 meq/L (ref 135–145)
Total Bilirubin: 0.6 mg/dL (ref 0.2–1.2)
Total Protein: 7.6 g/dL (ref 6.0–8.3)

## 2023-12-19 LAB — LIPID PANEL
Cholesterol: 137 mg/dL (ref 0–200)
HDL: 40.7 mg/dL (ref >39.00)
LDL Cholesterol: 73 mg/dL (ref 0–99)
NonHDL: 96.1
Total CHOL/HDL Ratio: 3
Triglycerides: 117 mg/dL (ref 0.0–149.0)
VLDL: 23.4 mg/dL (ref 0.0–40.0)

## 2023-12-19 LAB — TSH: TSH: 1.44 u[IU]/mL (ref 0.35–5.50)

## 2023-12-25 MED ORDER — OXYBUTYNIN CHLORIDE 5 MG PO TABS: 5.0000 mg | ORAL_TABLET | Freq: Four times a day (QID) | ORAL | 3 refills | Status: AC

## 2023-12-25 MED ORDER — SIMVASTATIN 40 MG PO TABS: 40.0000 mg | ORAL_TABLET | Freq: Every day | ORAL | 2 refills | Status: AC

## 2023-12-25 NOTE — Telephone Encounter (Signed)
 Copied from CRM #[id number]. Topic: Clinical - Medication Question >> Dec 25, 2023 10:00 AM Franky GRADE wrote: Reason for CRM: Patient is calling because [facility] informed patient they submitted a refill request for simvastatin  (ZOCOR ) 40 MG tablet [[id number]] & oxybutynin  (DITROPAN ) 5 MG tablet [[id number]]; however, they have not gotten the prescriptions and asked patient to contact the office. She is not out of medications but would like for the prescriptions to be sent.

## 2023-12-25 NOTE — Telephone Encounter (Signed)
 Resent the prescriptions to [facility], and patient notified VIA phone. Dm/cma

## 2023-12-30 ENCOUNTER — Ambulatory Visit

## 2024-01-06 ENCOUNTER — Other Ambulatory Visit: Payer: Self-pay | Admitting: Family Medicine

## 2024-01-06 DIAGNOSIS — I10 Essential (primary) hypertension: Secondary | ICD-10-CM

## 2024-01-14 ENCOUNTER — Other Ambulatory Visit: Payer: Self-pay | Admitting: Family Medicine

## 2024-01-20 ENCOUNTER — Ambulatory Visit

## 2024-01-27 ENCOUNTER — Ambulatory Visit

## 2024-02-07 ENCOUNTER — Other Ambulatory Visit: Payer: Self-pay | Admitting: Cardiology

## 2024-02-07 DIAGNOSIS — I4819 Other persistent atrial fibrillation: Secondary | ICD-10-CM

## 2024-03-02 ENCOUNTER — Ambulatory Visit

## 2024-03-11 ENCOUNTER — Ambulatory Visit: Admitting: Cardiology

## 2024-03-17 ENCOUNTER — Ambulatory Visit: Admitting: Family Medicine
